# Patient Record
Sex: Female | Born: 1935 | Race: White | Hispanic: No | Smoking: Former smoker
Health system: Southern US, Community
[De-identification: ages and names within clinical notes are randomized; demographics above are authoritative.]

## PROBLEM LIST (undated history)

## (undated) DIAGNOSIS — F419 Anxiety disorder, unspecified: Secondary | ICD-10-CM

## (undated) DIAGNOSIS — M1990 Unspecified osteoarthritis, unspecified site: Secondary | ICD-10-CM

## (undated) HISTORY — PX: APPENDECTOMY: SHX54

## (undated) HISTORY — DX: Unspecified osteoarthritis, unspecified site: M19.90

## (undated) HISTORY — PX: ABDOMINAL HYSTERECTOMY: SHX81

---

## 1999-01-20 ENCOUNTER — Other Ambulatory Visit: Admission: RE | Admit: 1999-01-20 | Discharge: 1999-01-20 | Payer: Self-pay | Admitting: Obstetrics and Gynecology

## 2000-01-21 ENCOUNTER — Other Ambulatory Visit: Admission: RE | Admit: 2000-01-21 | Discharge: 2000-01-21 | Payer: Self-pay | Admitting: Obstetrics and Gynecology

## 2001-01-20 ENCOUNTER — Other Ambulatory Visit: Admission: RE | Admit: 2001-01-20 | Discharge: 2001-01-20 | Payer: Self-pay | Admitting: Obstetrics and Gynecology

## 2001-09-08 ENCOUNTER — Encounter: Payer: Self-pay | Admitting: Emergency Medicine

## 2001-09-08 ENCOUNTER — Emergency Department (HOSPITAL_COMMUNITY): Admission: EM | Admit: 2001-09-08 | Discharge: 2001-09-08 | Payer: Self-pay | Admitting: Emergency Medicine

## 2002-06-25 ENCOUNTER — Ambulatory Visit (HOSPITAL_COMMUNITY): Admission: RE | Admit: 2002-06-25 | Discharge: 2002-06-25 | Payer: Self-pay | Admitting: Gastroenterology

## 2004-01-20 ENCOUNTER — Ambulatory Visit (HOSPITAL_COMMUNITY): Admission: RE | Admit: 2004-01-20 | Discharge: 2004-01-20 | Payer: Self-pay | Admitting: Orthopedic Surgery

## 2010-07-19 ENCOUNTER — Encounter: Payer: Self-pay | Admitting: Orthopedic Surgery

## 2011-03-18 ENCOUNTER — Other Ambulatory Visit: Payer: Self-pay | Admitting: Radiology

## 2011-07-08 DIAGNOSIS — K219 Gastro-esophageal reflux disease without esophagitis: Secondary | ICD-10-CM | POA: Diagnosis not present

## 2011-07-08 DIAGNOSIS — E785 Hyperlipidemia, unspecified: Secondary | ICD-10-CM | POA: Diagnosis not present

## 2011-07-08 DIAGNOSIS — Z0000 Encounter for general adult medical examination without abnormal findings: Secondary | ICD-10-CM | POA: Diagnosis not present

## 2011-07-08 DIAGNOSIS — M899 Disorder of bone, unspecified: Secondary | ICD-10-CM | POA: Diagnosis not present

## 2011-07-08 DIAGNOSIS — Z79899 Other long term (current) drug therapy: Secondary | ICD-10-CM | POA: Diagnosis not present

## 2011-07-08 DIAGNOSIS — Z124 Encounter for screening for malignant neoplasm of cervix: Secondary | ICD-10-CM | POA: Diagnosis not present

## 2011-07-12 DIAGNOSIS — Z1212 Encounter for screening for malignant neoplasm of rectum: Secondary | ICD-10-CM | POA: Diagnosis not present

## 2011-09-21 DIAGNOSIS — H4010X0 Unspecified open-angle glaucoma, stage unspecified: Secondary | ICD-10-CM | POA: Diagnosis not present

## 2011-09-21 DIAGNOSIS — H409 Unspecified glaucoma: Secondary | ICD-10-CM | POA: Diagnosis not present

## 2011-11-29 ENCOUNTER — Other Ambulatory Visit: Payer: Self-pay | Admitting: Gastroenterology

## 2011-11-29 DIAGNOSIS — D126 Benign neoplasm of colon, unspecified: Secondary | ICD-10-CM | POA: Diagnosis not present

## 2011-11-29 DIAGNOSIS — Z86010 Personal history of colonic polyps: Secondary | ICD-10-CM | POA: Diagnosis not present

## 2011-11-29 DIAGNOSIS — Z09 Encounter for follow-up examination after completed treatment for conditions other than malignant neoplasm: Secondary | ICD-10-CM | POA: Diagnosis not present

## 2012-03-20 DIAGNOSIS — Z1231 Encounter for screening mammogram for malignant neoplasm of breast: Secondary | ICD-10-CM | POA: Diagnosis not present

## 2012-07-04 DIAGNOSIS — Z79899 Other long term (current) drug therapy: Secondary | ICD-10-CM | POA: Diagnosis not present

## 2012-07-04 DIAGNOSIS — M899 Disorder of bone, unspecified: Secondary | ICD-10-CM | POA: Diagnosis not present

## 2012-07-04 DIAGNOSIS — E785 Hyperlipidemia, unspecified: Secondary | ICD-10-CM | POA: Diagnosis not present

## 2012-07-14 DIAGNOSIS — M949 Disorder of cartilage, unspecified: Secondary | ICD-10-CM | POA: Diagnosis not present

## 2012-07-14 DIAGNOSIS — Z79899 Other long term (current) drug therapy: Secondary | ICD-10-CM | POA: Diagnosis not present

## 2012-07-14 DIAGNOSIS — K219 Gastro-esophageal reflux disease without esophagitis: Secondary | ICD-10-CM | POA: Diagnosis not present

## 2012-07-14 DIAGNOSIS — Z1331 Encounter for screening for depression: Secondary | ICD-10-CM | POA: Diagnosis not present

## 2012-07-14 DIAGNOSIS — Z0000 Encounter for general adult medical examination without abnormal findings: Secondary | ICD-10-CM | POA: Diagnosis not present

## 2012-07-14 DIAGNOSIS — E785 Hyperlipidemia, unspecified: Secondary | ICD-10-CM | POA: Diagnosis not present

## 2012-07-17 DIAGNOSIS — Z1212 Encounter for screening for malignant neoplasm of rectum: Secondary | ICD-10-CM | POA: Diagnosis not present

## 2012-10-31 DIAGNOSIS — H4010X0 Unspecified open-angle glaucoma, stage unspecified: Secondary | ICD-10-CM | POA: Diagnosis not present

## 2012-10-31 DIAGNOSIS — H35039 Hypertensive retinopathy, unspecified eye: Secondary | ICD-10-CM | POA: Diagnosis not present

## 2012-10-31 DIAGNOSIS — H26499 Other secondary cataract, unspecified eye: Secondary | ICD-10-CM | POA: Diagnosis not present

## 2012-10-31 DIAGNOSIS — Z961 Presence of intraocular lens: Secondary | ICD-10-CM | POA: Diagnosis not present

## 2012-10-31 DIAGNOSIS — H4011X0 Primary open-angle glaucoma, stage unspecified: Secondary | ICD-10-CM | POA: Diagnosis not present

## 2012-10-31 DIAGNOSIS — H353190 Nonexudative age-related macular degeneration, unspecified eye, stage unspecified: Secondary | ICD-10-CM | POA: Diagnosis not present

## 2012-10-31 DIAGNOSIS — H409 Unspecified glaucoma: Secondary | ICD-10-CM | POA: Diagnosis not present

## 2012-12-21 DIAGNOSIS — IMO0002 Reserved for concepts with insufficient information to code with codable children: Secondary | ICD-10-CM | POA: Diagnosis not present

## 2013-02-02 DIAGNOSIS — M25569 Pain in unspecified knee: Secondary | ICD-10-CM | POA: Diagnosis not present

## 2013-02-27 DIAGNOSIS — H4011X0 Primary open-angle glaucoma, stage unspecified: Secondary | ICD-10-CM | POA: Diagnosis not present

## 2013-02-27 DIAGNOSIS — H409 Unspecified glaucoma: Secondary | ICD-10-CM | POA: Diagnosis not present

## 2013-02-27 DIAGNOSIS — L719 Rosacea, unspecified: Secondary | ICD-10-CM | POA: Diagnosis not present

## 2013-03-12 DIAGNOSIS — M25569 Pain in unspecified knee: Secondary | ICD-10-CM | POA: Diagnosis not present

## 2013-03-27 DIAGNOSIS — Z1231 Encounter for screening mammogram for malignant neoplasm of breast: Secondary | ICD-10-CM | POA: Diagnosis not present

## 2013-04-02 ENCOUNTER — Other Ambulatory Visit: Payer: Self-pay | Admitting: Internal Medicine

## 2013-04-02 DIAGNOSIS — R928 Other abnormal and inconclusive findings on diagnostic imaging of breast: Secondary | ICD-10-CM

## 2013-04-30 DIAGNOSIS — N6009 Solitary cyst of unspecified breast: Secondary | ICD-10-CM | POA: Diagnosis not present

## 2013-04-30 DIAGNOSIS — N6489 Other specified disorders of breast: Secondary | ICD-10-CM | POA: Diagnosis not present

## 2013-07-16 DIAGNOSIS — R82998 Other abnormal findings in urine: Secondary | ICD-10-CM | POA: Diagnosis not present

## 2013-07-16 DIAGNOSIS — M949 Disorder of cartilage, unspecified: Secondary | ICD-10-CM | POA: Diagnosis not present

## 2013-07-16 DIAGNOSIS — M899 Disorder of bone, unspecified: Secondary | ICD-10-CM | POA: Diagnosis not present

## 2013-07-16 DIAGNOSIS — E785 Hyperlipidemia, unspecified: Secondary | ICD-10-CM | POA: Diagnosis not present

## 2013-07-23 DIAGNOSIS — Z0000 Encounter for general adult medical examination without abnormal findings: Secondary | ICD-10-CM | POA: Diagnosis not present

## 2013-07-23 DIAGNOSIS — M949 Disorder of cartilage, unspecified: Secondary | ICD-10-CM | POA: Diagnosis not present

## 2013-07-23 DIAGNOSIS — K219 Gastro-esophageal reflux disease without esophagitis: Secondary | ICD-10-CM | POA: Diagnosis not present

## 2013-07-23 DIAGNOSIS — M899 Disorder of bone, unspecified: Secondary | ICD-10-CM | POA: Diagnosis not present

## 2013-07-23 DIAGNOSIS — Z1331 Encounter for screening for depression: Secondary | ICD-10-CM | POA: Diagnosis not present

## 2013-07-23 DIAGNOSIS — E785 Hyperlipidemia, unspecified: Secondary | ICD-10-CM | POA: Diagnosis not present

## 2013-07-23 DIAGNOSIS — Z79899 Other long term (current) drug therapy: Secondary | ICD-10-CM | POA: Diagnosis not present

## 2013-07-27 DIAGNOSIS — Z1212 Encounter for screening for malignant neoplasm of rectum: Secondary | ICD-10-CM | POA: Diagnosis not present

## 2013-11-20 DIAGNOSIS — H409 Unspecified glaucoma: Secondary | ICD-10-CM | POA: Diagnosis not present

## 2013-11-20 DIAGNOSIS — H524 Presbyopia: Secondary | ICD-10-CM | POA: Diagnosis not present

## 2013-11-20 DIAGNOSIS — H4011X0 Primary open-angle glaucoma, stage unspecified: Secondary | ICD-10-CM | POA: Diagnosis not present

## 2013-11-20 DIAGNOSIS — H353190 Nonexudative age-related macular degeneration, unspecified eye, stage unspecified: Secondary | ICD-10-CM | POA: Diagnosis not present

## 2013-11-20 DIAGNOSIS — H35039 Hypertensive retinopathy, unspecified eye: Secondary | ICD-10-CM | POA: Diagnosis not present

## 2014-04-20 ENCOUNTER — Ambulatory Visit (INDEPENDENT_AMBULATORY_CARE_PROVIDER_SITE_OTHER): Payer: Medicare Other

## 2014-04-20 ENCOUNTER — Ambulatory Visit (INDEPENDENT_AMBULATORY_CARE_PROVIDER_SITE_OTHER): Payer: Medicare Other | Admitting: Emergency Medicine

## 2014-04-20 VITALS — BP 136/62 | HR 64 | Temp 97.6°F | Resp 18 | Ht 59.5 in | Wt 151.2 lb

## 2014-04-20 DIAGNOSIS — M899 Disorder of bone, unspecified: Secondary | ICD-10-CM

## 2014-04-20 DIAGNOSIS — M898X5 Other specified disorders of bone, thigh: Secondary | ICD-10-CM

## 2014-04-20 DIAGNOSIS — M25552 Pain in left hip: Secondary | ICD-10-CM | POA: Diagnosis not present

## 2014-04-20 DIAGNOSIS — M4310 Spondylolisthesis, site unspecified: Secondary | ICD-10-CM

## 2014-04-20 DIAGNOSIS — M4316 Spondylolisthesis, lumbar region: Secondary | ICD-10-CM | POA: Diagnosis not present

## 2014-04-20 MED ORDER — PREDNISONE 10 MG PO TABS
ORAL_TABLET | ORAL | Status: DC
Start: 2014-04-20 — End: 2015-05-12

## 2014-04-20 NOTE — Patient Instructions (Signed)
Spondylolisthesis  The slipping of one or multiple vertebrae out of the correct anatomical position is a condition known as spondylolisthesis. Spondylolisthesis is most common in adolescents and is caused by a number of different reasons, such as vertebral fracture or something you are born with (congenital). Spondylolisthesis is diagnosed with the use of X-rays. SYMPTOMS   Dull, achy pain in the lower back.  Pain that worsens with extension of the spine.  Tightness of the muscles on the back of the thigh.  Lower back stiffness.  Signs of nerve damage: pain, numbness, or weakness affecting one or both lower extremities.  Muscle wasting (atrophy), uncommon.  Loss of stool (bowel) or urine (bladder) function. CAUSES  The symptoms of spondylolisthesis are caused by one or more vertebrae that are out of alignment, placing pressure on the spinal cord. Common mechanisms of injury include:  Congenital defect of the spine.  Degenerative process.  Stress fracture of the spine.  Fracture due to trauma to the spine. RISK INCREASES WITH:  Activities that have a risk of hyperextending the back.  Activities that have a risk of excessively rotating the spine.  Poor strength and flexibility.  Failure to warm up properly before activity.  Family history of spondylolysis or spondylolisthesis.  Improper sports technique. PREVENTION  Warm up and stretch properly before activity.  Allow for adequate recovery between workouts.  Maintain physical fitness:  Strength, flexibility, and endurance.  Cardiovascular fitness.  Learn and use proper technique. When possible, have a coach correct improper technique. PROGNOSIS  If treated properly, the spondylolisthesis usually resolves. RELATED COMPLICATIONS   Recurrent symptoms that result in a chronic problem.  Inability to compete in athletics.  Prolonged healing time, if improperly treated or reinjured.  Failure of the fracture to  heal (nonunion).  Healing of the fracture in a poor position (malunion). TREATMENT Treatment initially involves resting from any activities that aggravate the symptoms and the use of ice and medications to help reduce pain and inflammation. The use of strengthening and stretching exercises may help reduce pain with activity. These exercises may be performed at home or with referral to a therapist. It is important to learn how to use proper body mechanics as to not place undue stress on your spine. If the injury is severe, then your caregiver may recommend a back brace to allow for healing, or even surgery. Surgery often involves fusing two adjacent vertebrae so no movement is allowed between them.  MEDICATION   If pain medication is necessary, then nonsteroidal anti-inflammatory medications, such as aspirin and ibuprofen, or other minor pain relievers, such as acetaminophen, are often recommended.  Do not take pain medication for 7 days before surgery.  Prescription pain relievers may be given if deemed necessary by your caregiver. Use only as directed and only as much as you need. HEAT AND COLD  Cold treatment (icing) relieves pain and reduces inflammation. Cold treatment should be applied for 10 to 15 minutes every 2 to 3 hours for inflammation and pain and immediately after any activity that aggravates your symptoms. Use ice packs or massage the area with a piece of ice (ice massage).  Heat treatment may be used prior to performing the stretching and strengthening activities prescribed by your caregiver, physical therapist, or athletic trainer. Use a heat pack or soak the injury in warm water. SEEK MEDICAL CARE IF:  Treatment seems to offer no benefit, or the condition worsens.  Any medications produce adverse side effects.  Any complications from surgery occur:  Pain, numbness, or coldness in the extremity operated upon.  Discoloration of the nail beds (they become blue or gray) of the  extremity operated upon.  Signs of infections (fever, pain, inflammation, redness, or persistent bleeding).

## 2014-04-20 NOTE — Progress Notes (Signed)
Subjective:  This chart was scribed for Eliezer Champagne, MD by Mercy Moore, Medial Scribe. This patient was seen in room 4 and the patient's care was started at 8:53 AM.    Patient ID: Cheyenne Montgomery, female    DOB: Nov 07, 1934, 79 y.o.   MRN: [id number]  Chief Complaint  Patient presents with  . Leg Pain    right leg from knee to hip x 1 week    HPI HPI Comments: Cheyenne Montgomery is a 79 y.o. female who presents to the Emergency Department complaining of right leg pain for 1.5 weeks. Patient reports recently having hardwood floors installed in her home. After the installation she reports helping move things back into the house. As a result of all the activity patient reports development of left leg pain due to over exertion. Patient locates left leg pain primarily in her knee that extends laterally up to the hip. Patient also describes associated tinge in her left toes. Patient reports sleep disturbance due to pain severity stating that it wakes her up during the night. Patient reports treatment with ibuprofen without relief. Patient denies right leg pain or mid back pain.   There are no active problems to display for this patient.  No past medical history on file. No past surgical history on file. Allergies  Allergen Reactions  . Doxycycline Other (See Comments)    unknown  . Penicillins Rash   Prior to Admission medications   Not on File   History   Social History  . Marital Status: Widowed    Spouse Name: N/A    Number of Children: N/A  . Years of Education: N/A   Occupational History  . Not on file.   Social History Main Topics  . Smoking status: Former Research scientist (life sciences)  . Smokeless tobacco: Never Used  . Alcohol Use: No  . Drug Use: No  . Sexual Activity: Not on file   Other Topics Concern  . Not on file   Social History Narrative  . No narrative on file    Review of Systems  Constitutional: Negative for fever and chills.  Musculoskeletal: Positive for arthralgias.  Negative for gait problem.  Neurological: Negative for weakness and numbness.       Objective:   Physical Exam  Nursing note and vitals reviewed.   CONSTITUTIONAL: Well developed/well nourished HEAD: Normocephalic/atraumatic EYES: EOMI/PERRL ENMT: Mucous membranes moist NECK: supple no meningeal signs SPINE:entire spine nontender CV: S1/S2 noted, no murmurs/rubs/gallops noted LUNGS: Lungs are clear to auscultation bilaterally, no apparent distress ABDOMEN: soft, nontender, no rebound or guarding GU:no cva tenderness NEURO: Pt is awake/alert, moves all extremitiesx4 EXTREMITIES: pulses normal, full ROM; no tenderness over the lumbar spine; mild tenderness to upper lateral left thigh which extends down to the knee; mild discomfort with external/internal rotation of the hip; reflexes and motor strength are symmetrical; healed scars over both great toes SKIN: warm, color normal PSYCH: no abnormalities of mood noted  Filed Vitals:   04/20/14 0846  BP: 136/62  Pulse: 64  Temp: 97.6 F (36.4 C)  TempSrc: Oral  Resp: 18  Height: 4' 11.5" (1.511 m)  Weight: 151 lb 3.2 oz (68.584 kg)  SpO2: 96%  [facility] reading (PRIMARY) by  Dr. Everlene Farrier LS spine films show an L4-L5 grade 1 spondylolisthesis. Hip films show mild degenerative changes. Femur films are normal.       Assessment & Plan:  Patient has a radiculopathy most likely secondary to her spondylolisthesis. Advise she see a  specialist regarding this. She will be on a prednisone taper. Her son-in-law is a physician and she was given copies of her x-rays for him to review.   I personally performed the services described in this documentation, which was scribed in my presence. The recorded information has been reviewed and is accurate.

## 2014-04-26 DIAGNOSIS — H35372 Puckering of macula, left eye: Secondary | ICD-10-CM | POA: Diagnosis not present

## 2014-04-26 DIAGNOSIS — H4010X0 Unspecified open-angle glaucoma, stage unspecified: Secondary | ICD-10-CM | POA: Diagnosis not present

## 2014-04-26 DIAGNOSIS — H5005 Alternating esotropia: Secondary | ICD-10-CM | POA: Diagnosis not present

## 2014-04-26 DIAGNOSIS — H3531 Nonexudative age-related macular degeneration: Secondary | ICD-10-CM | POA: Diagnosis not present

## 2014-05-07 DIAGNOSIS — Z1231 Encounter for screening mammogram for malignant neoplasm of breast: Secondary | ICD-10-CM | POA: Diagnosis not present

## 2015-01-22 IMAGING — CR DG HIP (WITH OR WITHOUT PELVIS) 2-3V*L*
3 series · 3 of 3 positions shown · non-contrast
Comparison: No priors.

CLINICAL DATA: 79-year-old female complaining of left-sided hip
pain.

EXAM:
LEFT HIP - COMPLETE 2+ VIEW

[AP (1 of 2)]
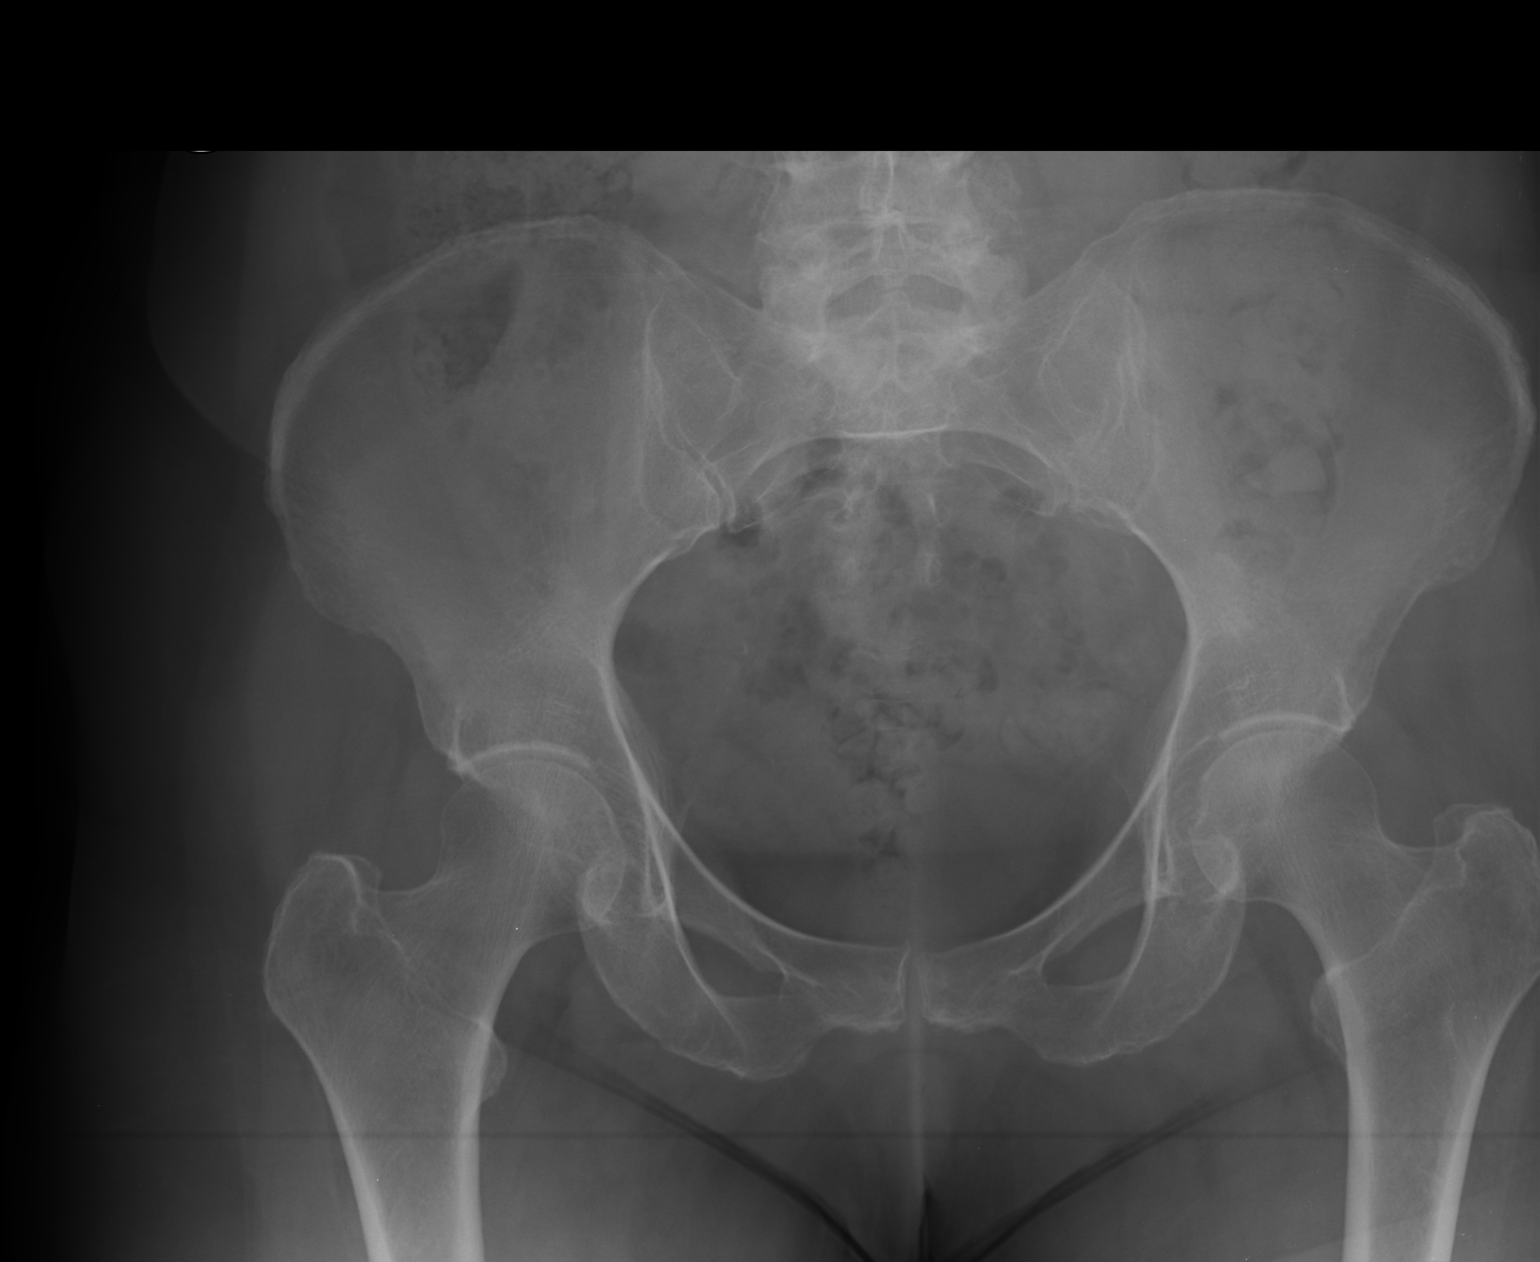

[AP (2 of 2)]
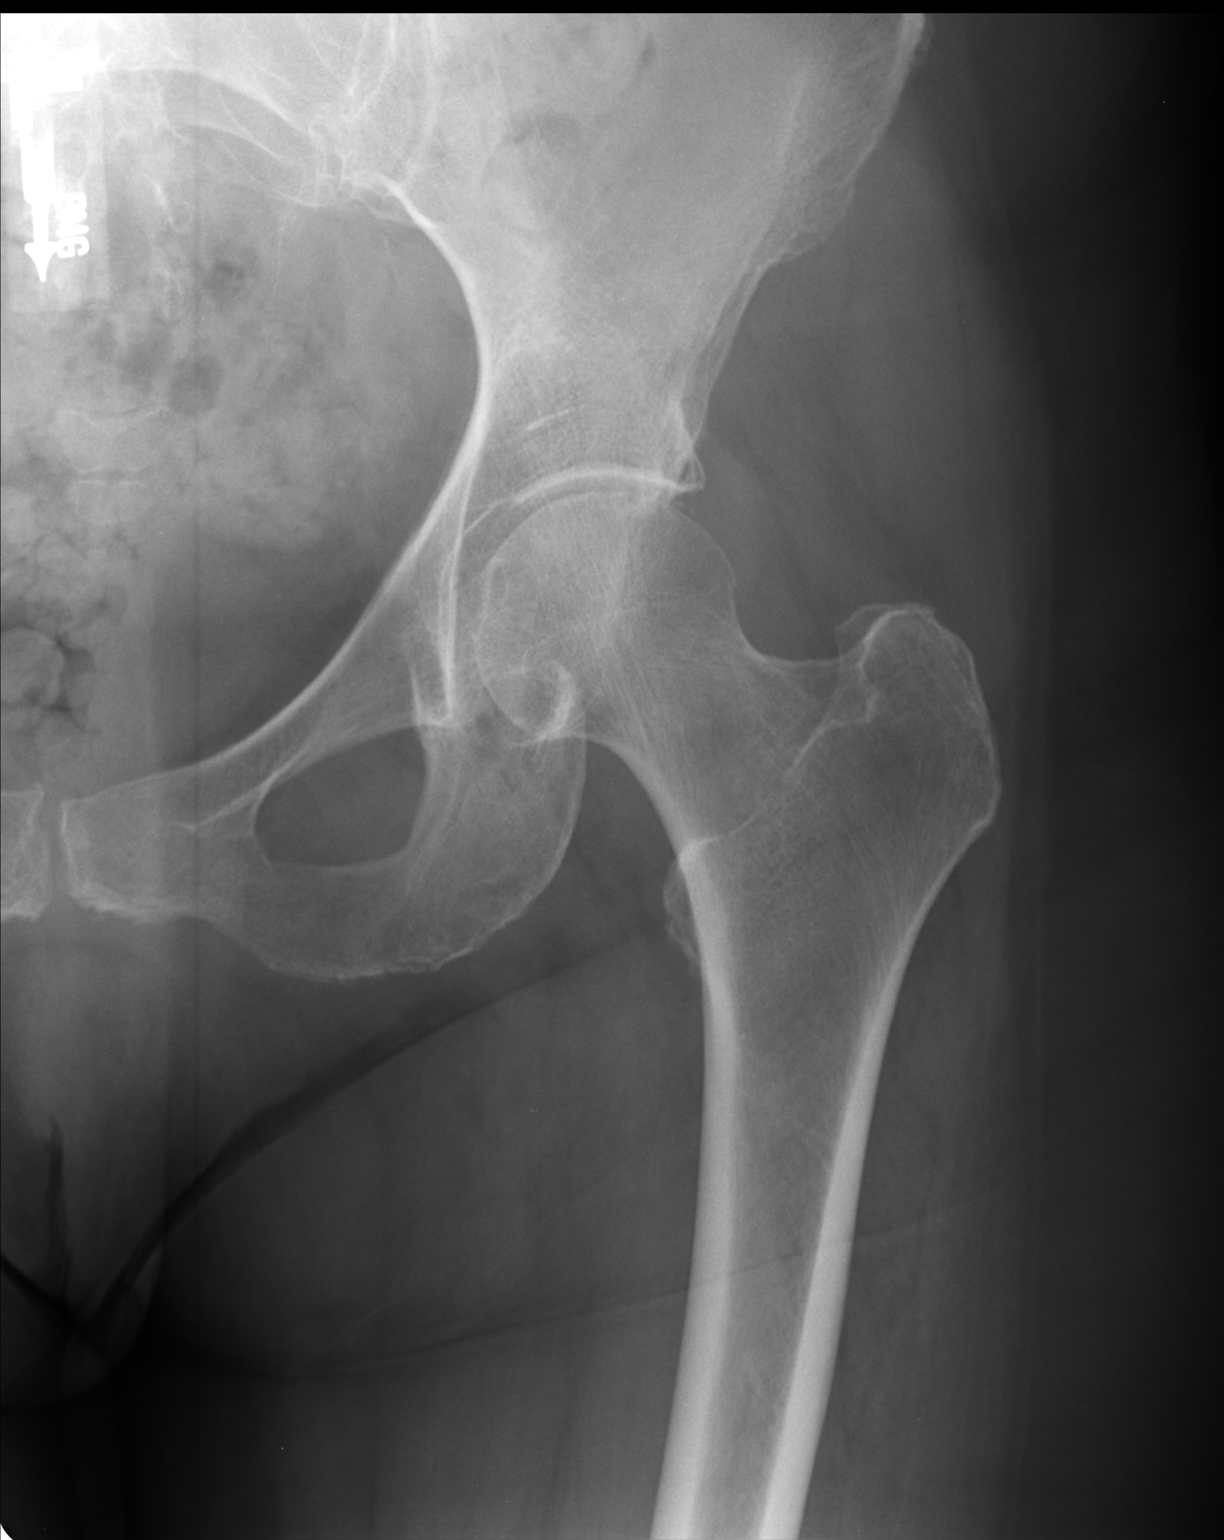

[lateral]
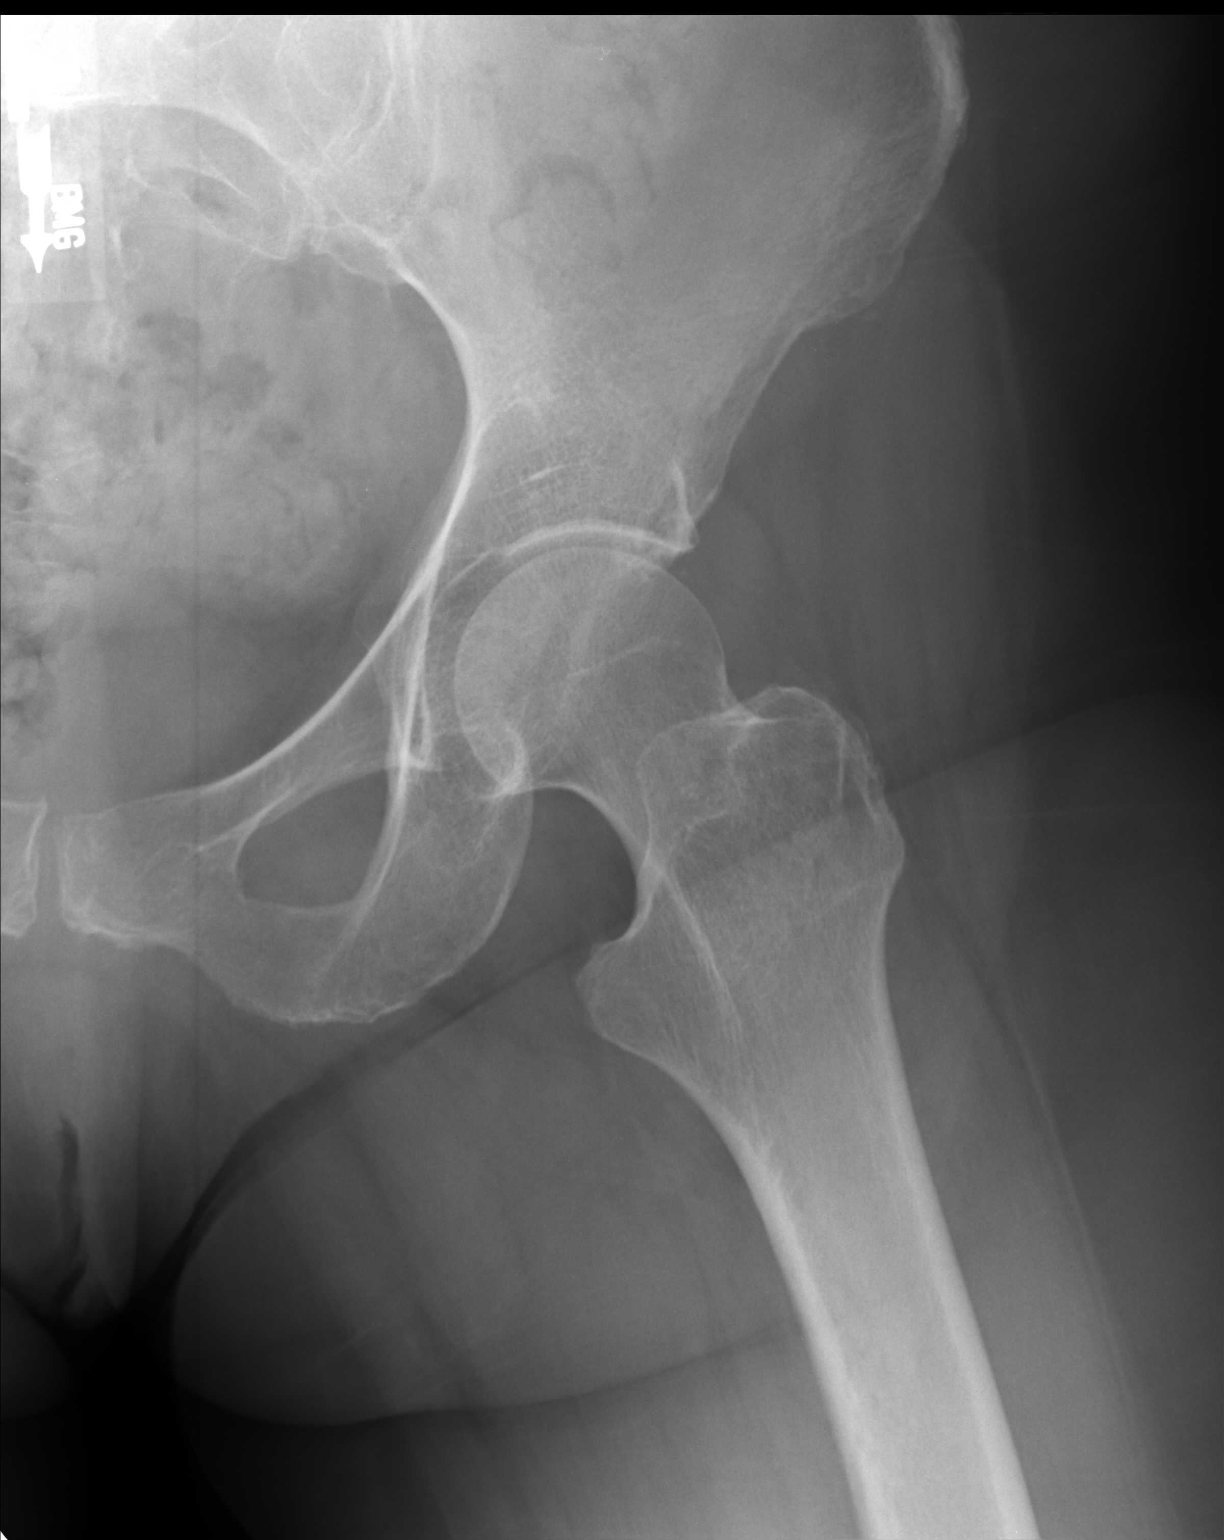

[3 of 3 positions shown; findings below may reference images not displayed]

FINDINGS: AP view of the pelvis and AP and lateral views of the left hip
demonstrate no acute displaced fracture of the bony pelvic ring.
Bilateral proximal femurs as visualized are intact, and the left
femoral head is properly located. Mild degenerative changes of
osteoarthritis are noted in the hip joints bilaterally.
IMPRESSION: 1. No acute radiographic abnormality of the bony pelvis or the left
hip.
2. Mild bilateral hip joint osteoarthritis.

## 2015-01-22 IMAGING — CR DG FEMUR 2V*L*
4 series · 4 of 4 positions shown · non-contrast
Comparison: No priors.

CLINICAL DATA: [age] year female complaining of left leg
pain.

EXAM:
LEFT FEMUR - 2 VIEW

[AP (1 of 2)]
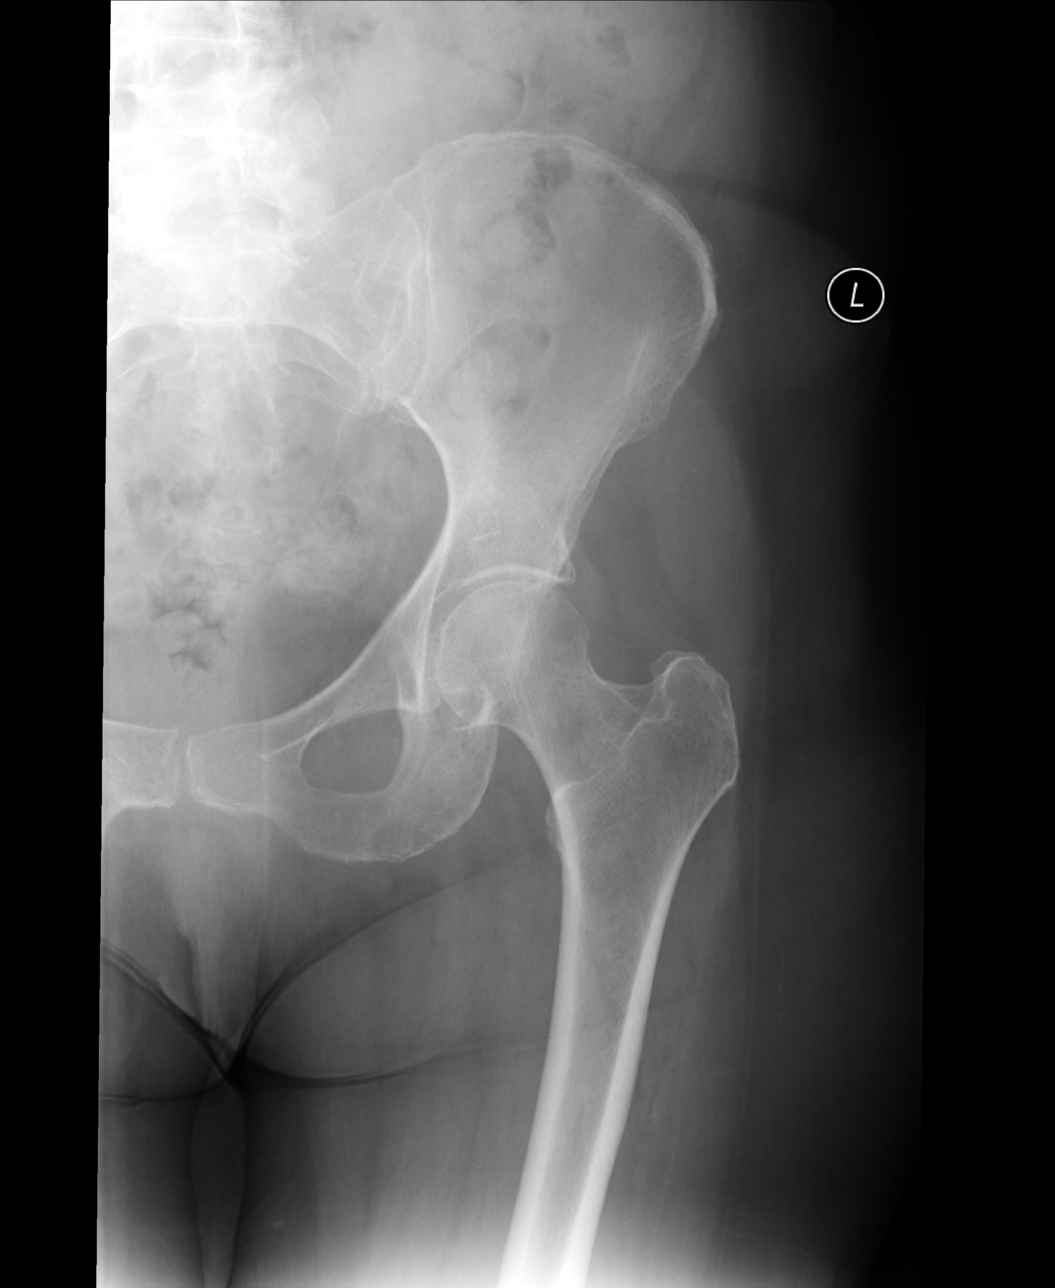

[lateral (1 of 2)]
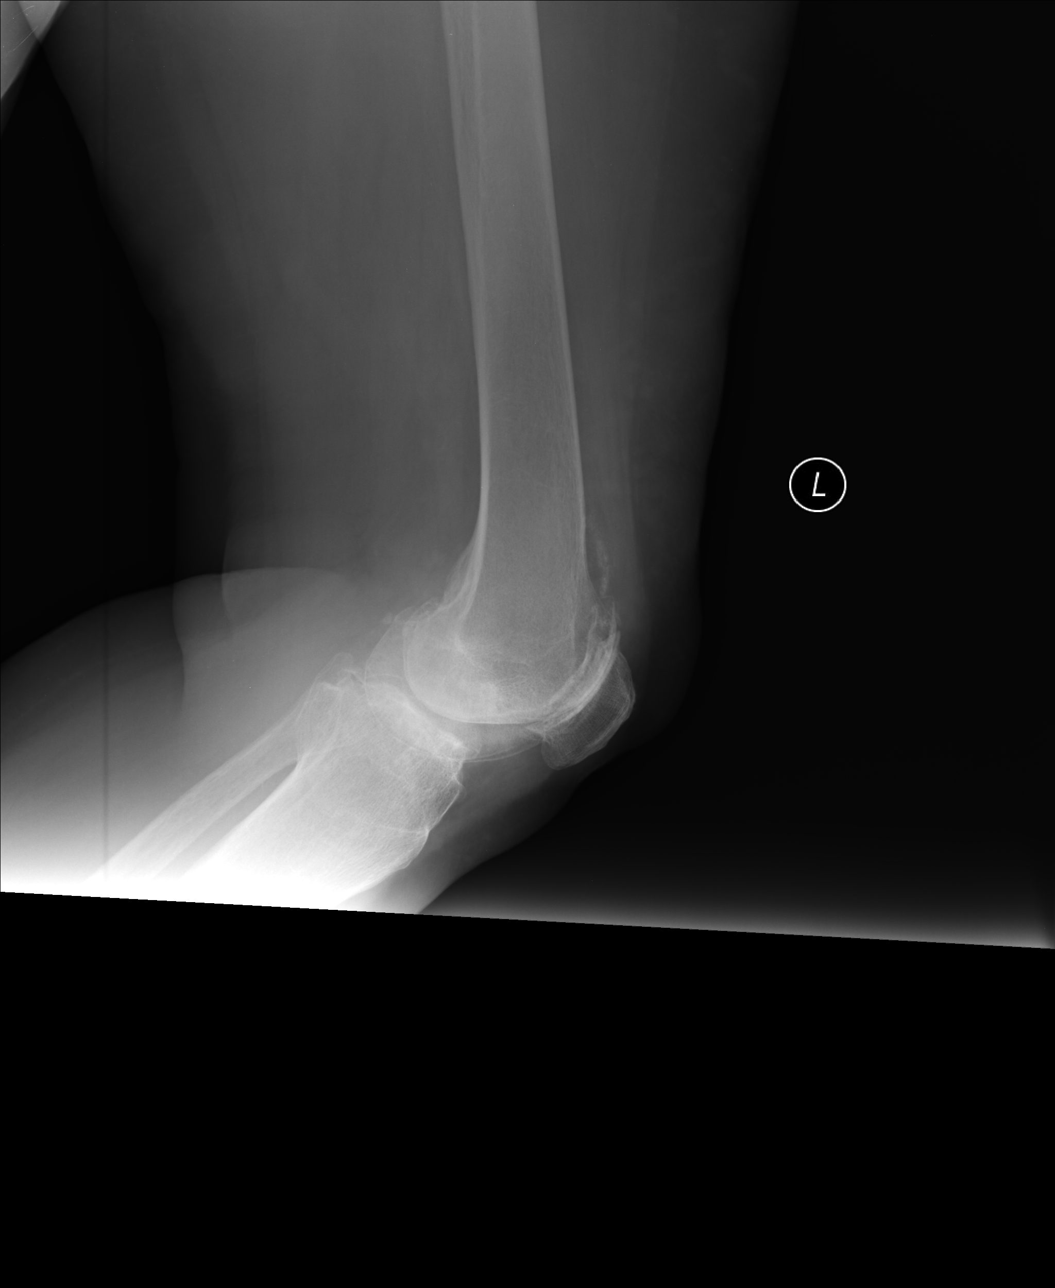

[AP (2 of 2)]
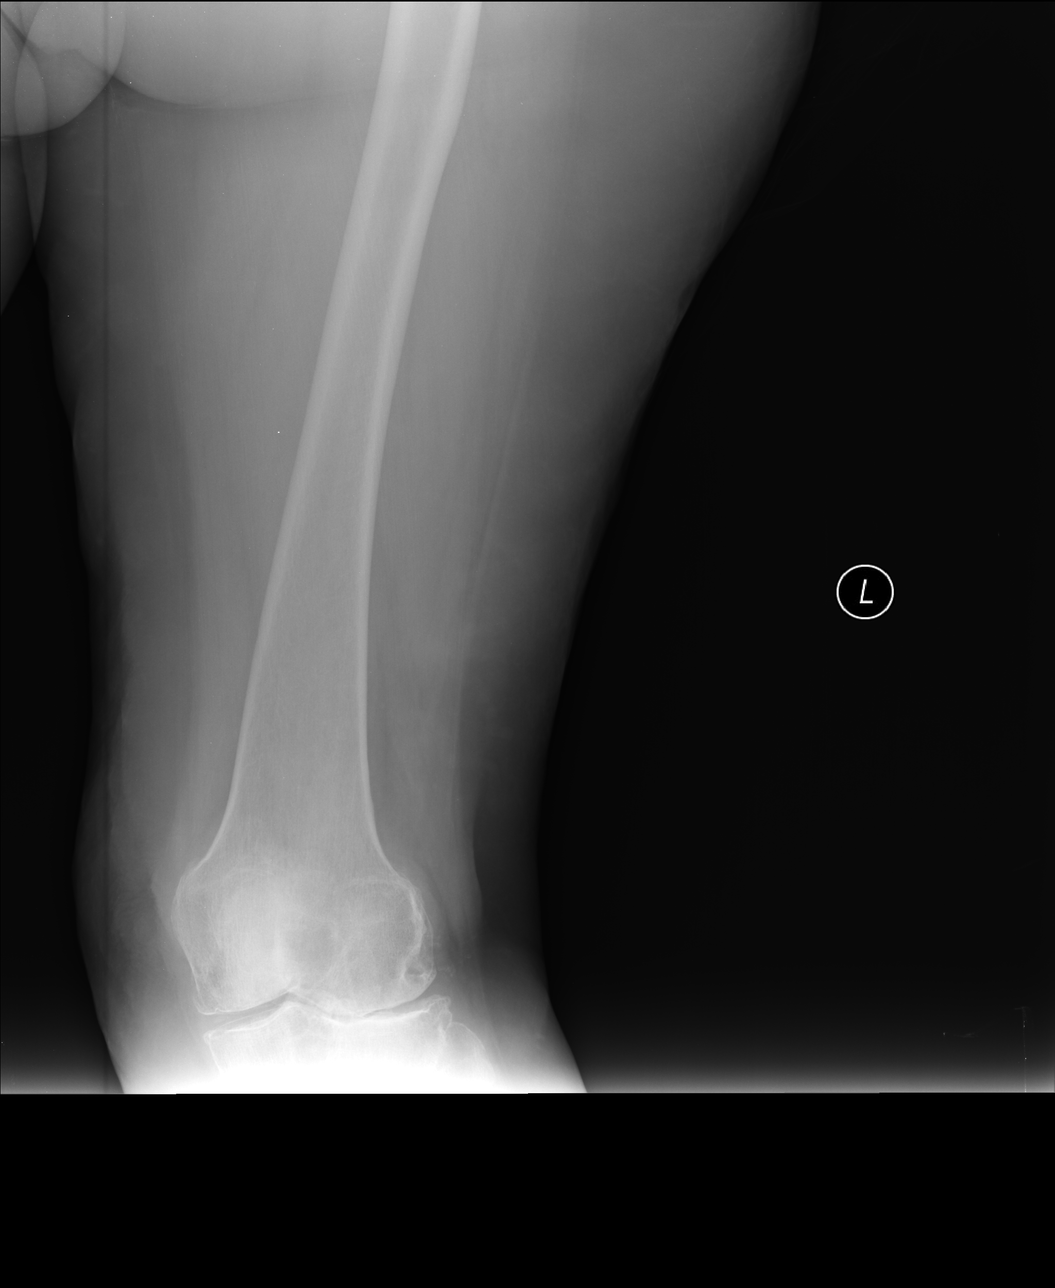

[lateral (2 of 2)]
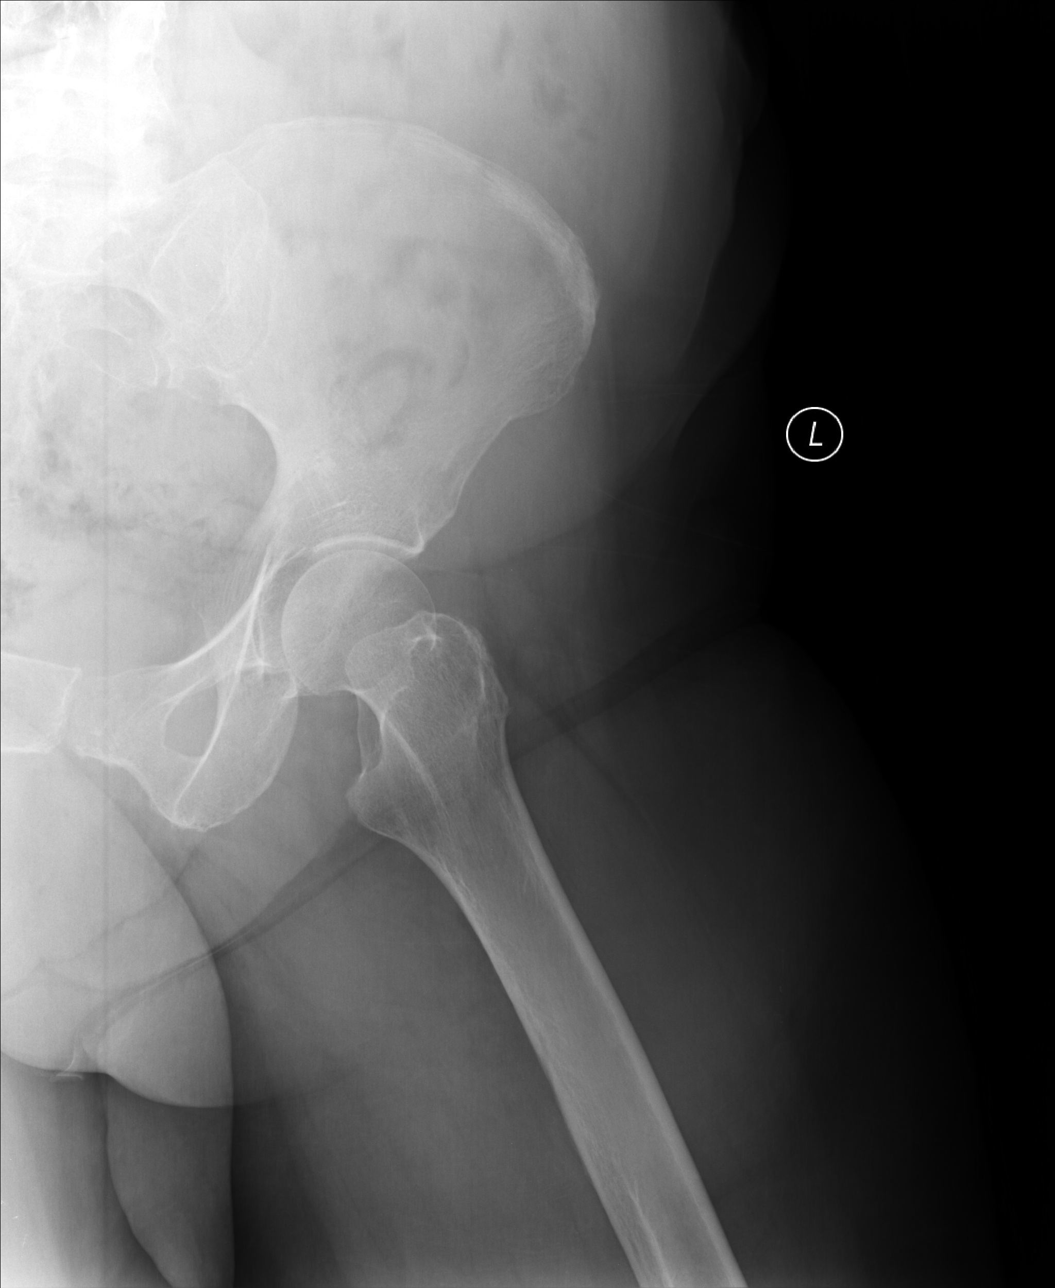

[4 of 4 positions shown; findings below may reference images not displayed]

FINDINGS: No acute displaced fracture of the femur. Advanced degenerative
changes of osteoarthritis are noted in the knee joint, most severe
in the lateral compartment and patellofemoral compartment, and there
appear to be multiple indistinct calcifications in the joint space,
particularly along the suprapatellar bursa, suggestive of potential
synovial osteochondromatosis or dystrophic synovial calcifications.
IMPRESSION: 1. No acute radiographic abnormality of the left femur.
2. Osteoarthritis in the knee joint, most severe in the lateral and
patellofemoral compartments.
3. Multifocal calcifications in the joint space suggestive of
potential synovial osteochondromatosis or dystrophic synovial
calcifications.

## 2015-05-12 ENCOUNTER — Encounter (HOSPITAL_BASED_OUTPATIENT_CLINIC_OR_DEPARTMENT_OTHER): Payer: Self-pay | Admitting: *Deleted

## 2015-05-13 ENCOUNTER — Encounter (HOSPITAL_BASED_OUTPATIENT_CLINIC_OR_DEPARTMENT_OTHER)
Admission: RE | Admit: 2015-05-13 | Discharge: 2015-05-13 | Disposition: A | Discharge status: Home / Self Care | Payer: Medicare Other | Source: Ambulatory Visit | Attending: Plastic Surgery | Admitting: Plastic Surgery

## 2015-05-13 DIAGNOSIS — F419 Anxiety disorder, unspecified: Secondary | ICD-10-CM | POA: Diagnosis not present

## 2015-05-13 DIAGNOSIS — H5347 Heteronymous bilateral field defects: Secondary | ICD-10-CM | POA: Diagnosis not present

## 2015-05-13 DIAGNOSIS — M1990 Unspecified osteoarthritis, unspecified site: Secondary | ICD-10-CM | POA: Diagnosis not present

## 2015-05-13 DIAGNOSIS — Z87891 Personal history of nicotine dependence: Secondary | ICD-10-CM | POA: Diagnosis not present

## 2015-05-13 DIAGNOSIS — H02403 Unspecified ptosis of bilateral eyelids: Secondary | ICD-10-CM | POA: Diagnosis not present

## 2015-05-13 LAB — CBC WITH DIFFERENTIAL/PLATELET
Basophils Absolute: 0.1 10*3/uL (ref 0.0–0.1)
Basophils Relative: 1 %
Eosinophils Absolute: 0.2 10*3/uL (ref 0.0–0.7)
Eosinophils Relative: 3 %
HEMATOCRIT: 38.1 % (ref 36.0–46.0)
HEMOGLOBIN: 12.5 g/dL (ref 12.0–15.0)
LYMPHS ABS: 1.3 10*3/uL (ref 0.7–4.0)
Lymphocytes Relative: 27 %
MCH: 31.2 pg (ref 26.0–34.0)
MCHC: 32.8 g/dL (ref 30.0–36.0)
MCV: 95 fL (ref 78.0–100.0)
MONOS PCT: 9 %
Monocytes Absolute: 0.4 10*3/uL (ref 0.1–1.0)
NEUTROS ABS: 2.9 10*3/uL (ref 1.7–7.7)
NEUTROS PCT: 60 %
Platelets: 196 10*3/uL (ref 150–400)
RBC: 4.01 MIL/uL (ref 3.87–5.11)
RDW: 13 % (ref 11.5–15.5)
WBC: 4.8 10*3/uL (ref 4.0–10.5)

## 2015-05-13 LAB — BASIC METABOLIC PANEL
ANION GAP: 4 — AB (ref 5–15)
BUN: 12 mg/dL (ref 6–20)
CHLORIDE: 105 mmol/L (ref 101–111)
CO2: 31 mmol/L (ref 22–32)
Calcium: 9.7 mg/dL (ref 8.9–10.3)
Creatinine, Ser: 0.75 mg/dL (ref 0.44–1.00)
GFR calc non Af Amer: >60 mL/min (ref >60)
Glucose, Bld: 92 mg/dL (ref 65–99)
Potassium: 4.5 mmol/L (ref 3.5–5.1)
Sodium: 140 mmol/L (ref 135–145)

## 2015-05-14 NOTE — H&P (Signed)
  Subjective:    Patient ID: Cheyenne Montgomery is a 80 y.o. female.  Follow-up   Patient with recent VFE demonstrating bilateral superior filed defect at least 20 degrees that resolved with taping. Plan bilateral ptosis repair through upper blepharoplasty incisions, direct brow lift prehairline. Wears glasses for reading. Has history glaucoma on right and underwent surgery for this with resolution. Uses OTC eye drops for dry eyes daily, worse a little on right.   Review of Systems     Objective:   Physical Exam  HENT:  Well healed facelift incisions  Cardiovascular: Normal rate, regular rhythm and normal heart sounds.  Pulmonary/Chest: Effort normal and breath sounds normal.  Skin:  Fitzpatrick 2   HEENT: with frontalis blocked,  Brow to Mid pupil R 1.5 cm L 1.8 cm MRD Right 0 Left 87mm No lower lid malposition, able to distract lower lids > 4 mm from globe with delayed snap back EOMI, With frontalis blocked, levator excursion approximately 12 mm bilateral  Hairline to brow over 6 cm    Assessment:     Eyelid ptosis Brow ptosis     Plan:     Reviewed ptosis repair with upper lid incision, levator plication vs advancement. Counseled most common problem with this surgery will be asymmetry or need for revision. Procedure could be done under MAC vs GA. This will exacerbate her dry eyes at least temporarily and possible permanently.  Reviewed brow lift prehairline direct excision, scar from temple to temple. Brows will descend with aging and may require additional procedures. Reviewed expected bruising, numbness forehead and scalp followed by itching sensation, risk scar alopecia, scar maturation over months, use of ophthalmic lubricant that can produce blurry vision.   New pictures taken. ASA has been held. Reviewed OP procedure, daughter will stay in home with her night of procedure. Rx for percocet given, counseled ok to use tylenol or advil instead.  Irene Limbo, MD  [facility] Plastic & Reconstructive Surgery [phone]

## 2015-05-16 ENCOUNTER — Ambulatory Visit (HOSPITAL_BASED_OUTPATIENT_CLINIC_OR_DEPARTMENT_OTHER): Payer: Medicare Other | Admitting: Anesthesiology

## 2015-05-16 ENCOUNTER — Encounter (HOSPITAL_BASED_OUTPATIENT_CLINIC_OR_DEPARTMENT_OTHER): Payer: Self-pay | Admitting: *Deleted

## 2015-05-16 ENCOUNTER — Encounter (HOSPITAL_BASED_OUTPATIENT_CLINIC_OR_DEPARTMENT_OTHER)
Admission: RE | Disposition: A | Discharge status: Home / Self Care | Payer: Self-pay | Source: Ambulatory Visit | Attending: Plastic Surgery

## 2015-05-16 ENCOUNTER — Ambulatory Visit (HOSPITAL_BASED_OUTPATIENT_CLINIC_OR_DEPARTMENT_OTHER)
Admission: RE | Admit: 2015-05-16 | Discharge: 2015-05-16 | Disposition: A | Discharge status: Home / Self Care | Payer: Medicare Other | Source: Ambulatory Visit | Attending: Plastic Surgery | Admitting: Plastic Surgery

## 2015-05-16 DIAGNOSIS — Z87891 Personal history of nicotine dependence: Secondary | ICD-10-CM | POA: Insufficient documentation

## 2015-05-16 DIAGNOSIS — F419 Anxiety disorder, unspecified: Secondary | ICD-10-CM | POA: Diagnosis not present

## 2015-05-16 DIAGNOSIS — H5347 Heteronymous bilateral field defects: Secondary | ICD-10-CM | POA: Diagnosis not present

## 2015-05-16 DIAGNOSIS — H02403 Unspecified ptosis of bilateral eyelids: Secondary | ICD-10-CM | POA: Diagnosis not present

## 2015-05-16 DIAGNOSIS — M1990 Unspecified osteoarthritis, unspecified site: Secondary | ICD-10-CM | POA: Insufficient documentation

## 2015-05-16 HISTORY — DX: Anxiety disorder, unspecified: F41.9

## 2015-05-16 HISTORY — PX: BROW LIFT: SHX178

## 2015-05-16 HISTORY — PX: PTOSIS REPAIR: SHX6568

## 2015-05-16 SURGERY — REPAIR, BLEPHAROPTOSIS
Anesthesia: General | Site: Face | Laterality: Bilateral

## 2015-05-16 MED ORDER — OXYCODONE HCL 5 MG/5ML PO SOLN: 5.0000 mg | Freq: Once | ORAL | Status: DC | PRN

## 2015-05-16 MED ORDER — FENTANYL CITRATE (PF) 100 MCG/2ML IJ SOLN
INTRAMUSCULAR | Status: AC
  Filled 2015-05-16: qty 2

## 2015-05-16 MED ORDER — TOBRAMYCIN-DEXAMETHASONE 0.3-0.1 % OP OINT
TOPICAL_OINTMENT | OPHTHALMIC | Status: AC
  Filled 2015-05-16: qty 3.5

## 2015-05-16 MED ORDER — PROPOFOL 10 MG/ML IV BOLUS
INTRAVENOUS | Status: DC | PRN
  Administered 2015-05-16: 110 mg via INTRAVENOUS

## 2015-05-16 MED ORDER — PHENYLEPHRINE HCL 10 MG/ML IJ SOLN
INTRAMUSCULAR | Status: DC | PRN
  Administered 2015-05-16 (×3): 40 ug via INTRAVENOUS

## 2015-05-16 MED ORDER — BUPIVACAINE-EPINEPHRINE (PF) 0.25% -1:200000 IJ SOLN
INTRAMUSCULAR | Status: AC
  Filled 2015-05-16: qty 30

## 2015-05-16 MED ORDER — MIDAZOLAM HCL 2 MG/2ML IJ SOLN
INTRAMUSCULAR | Status: AC
  Filled 2015-05-16: qty 2

## 2015-05-16 MED ORDER — PROPOFOL 500 MG/50ML IV EMUL
INTRAVENOUS | Status: AC
  Filled 2015-05-16: qty 50

## 2015-05-16 MED ORDER — GLYCOPYRROLATE 0.2 MG/ML IJ SOLN: 0.2000 mg | Freq: Once | INTRAMUSCULAR | Status: DC | PRN

## 2015-05-16 MED ORDER — LIDOCAINE-EPINEPHRINE 1 %-1:100000 IJ SOLN
INTRAMUSCULAR | Status: AC
  Filled 2015-05-16: qty 1

## 2015-05-16 MED ORDER — ONDANSETRON HCL 4 MG/2ML IJ SOLN
INTRAMUSCULAR | Status: AC
  Filled 2015-05-16: qty 2

## 2015-05-16 MED ORDER — ONDANSETRON HCL 4 MG/2ML IJ SOLN
INTRAMUSCULAR | Status: AC
Start: 2015-05-16 — End: 2015-05-16
  Filled 2015-05-16: qty 2

## 2015-05-16 MED ORDER — FENTANYL CITRATE (PF) 100 MCG/2ML IJ SOLN
INTRAMUSCULAR | Status: DC | PRN
  Administered 2015-05-16: 50 ug via INTRAVENOUS

## 2015-05-16 MED ORDER — TOBRAMYCIN-DEXAMETHASONE 0.3-0.1 % OP OINT
TOPICAL_OINTMENT | OPHTHALMIC | Status: DC | PRN
Start: 2015-05-16 — End: 2015-05-16
  Administered 2015-05-16: 1 via OPHTHALMIC

## 2015-05-16 MED ORDER — ONDANSETRON HCL 4 MG/2ML IJ SOLN
4.0000 mg | Freq: Four times a day (QID) | INTRAMUSCULAR | Status: AC | PRN
  Administered 2015-05-16: 4 mg via INTRAVENOUS

## 2015-05-16 MED ORDER — PHENYLEPHRINE 40 MCG/ML (10ML) SYRINGE FOR IV PUSH (FOR BLOOD PRESSURE SUPPORT)
PREFILLED_SYRINGE | INTRAVENOUS | Status: AC
  Filled 2015-05-16: qty 10

## 2015-05-16 MED ORDER — BACITRACIN 500 UNIT/GM EX OINT
TOPICAL_OINTMENT | CUTANEOUS | Status: DC | PRN
  Administered 2015-05-16: 1 via TOPICAL

## 2015-05-16 MED ORDER — CLINDAMYCIN PHOSPHATE 600 MG/50ML IV SOLN
INTRAVENOUS | Status: AC
  Filled 2015-05-16: qty 50

## 2015-05-16 MED ORDER — DEXAMETHASONE SODIUM PHOSPHATE 10 MG/ML IJ SOLN
INTRAMUSCULAR | Status: AC
  Filled 2015-05-16: qty 1

## 2015-05-16 MED ORDER — LIDOCAINE HCL (CARDIAC) 20 MG/ML IV SOLN
INTRAVENOUS | Status: AC
  Filled 2015-05-16: qty 5

## 2015-05-16 MED ORDER — ONDANSETRON HCL 4 MG/2ML IJ SOLN
INTRAMUSCULAR | Status: DC | PRN
  Administered 2015-05-16: 4 mg via INTRAVENOUS

## 2015-05-16 MED ORDER — BACITRACIN ZINC 500 UNIT/GM EX OINT
TOPICAL_OINTMENT | CUTANEOUS | Status: AC
  Filled 2015-05-16: qty 0.9

## 2015-05-16 MED ORDER — LACTATED RINGERS IV SOLN
INTRAVENOUS | Status: DC
Start: 2015-05-16 — End: 2015-05-16
  Administered 2015-05-16 (×3): via INTRAVENOUS

## 2015-05-16 MED ORDER — FENTANYL CITRATE (PF) 100 MCG/2ML IJ SOLN
25.0000 ug | INTRAMUSCULAR | Status: DC | PRN
  Administered 2015-05-16 (×2): 25 ug via INTRAVENOUS
  Administered 2015-05-16 (×2): 50 ug via INTRAVENOUS

## 2015-05-16 MED ORDER — DEXAMETHASONE SODIUM PHOSPHATE 4 MG/ML IJ SOLN
INTRAMUSCULAR | Status: DC | PRN
  Administered 2015-05-16: 5 mg via INTRAVENOUS

## 2015-05-16 MED ORDER — SCOPOLAMINE 1 MG/3DAYS TD PT72: 1.0000 | MEDICATED_PATCH | Freq: Once | TRANSDERMAL | Status: DC | PRN

## 2015-05-16 MED ORDER — BSS IO SOLN
INTRAOCULAR | Status: AC
  Filled 2015-05-16: qty 15

## 2015-05-16 MED ORDER — CLINDAMYCIN PHOSPHATE 600 MG/50ML IV SOLN
600.0000 mg | Freq: Once | INTRAVENOUS | Status: AC
  Administered 2015-05-16: 600 mg via INTRAVENOUS

## 2015-05-16 MED ORDER — LIDOCAINE-EPINEPHRINE 1 %-1:100000 IJ SOLN
INTRAMUSCULAR | Status: DC | PRN
  Administered 2015-05-16: 4 mL

## 2015-05-16 MED ORDER — OXYCODONE HCL 5 MG PO TABS: 5.0000 mg | ORAL_TABLET | Freq: Once | ORAL | Status: DC | PRN

## 2015-05-16 MED ORDER — LIDOCAINE HCL (CARDIAC) 20 MG/ML IV SOLN
INTRAVENOUS | Status: DC | PRN
  Administered 2015-05-16: 60 mg via INTRAVENOUS

## 2015-05-16 MED ORDER — MIDAZOLAM HCL 5 MG/5ML IJ SOLN
INTRAMUSCULAR | Status: DC | PRN
  Administered 2015-05-16: 1 mg via INTRAVENOUS

## 2015-05-16 MED ORDER — PROPOFOL 10 MG/ML IV BOLUS
INTRAVENOUS | Status: AC
  Filled 2015-05-16: qty 20

## 2015-05-16 SURGICAL SUPPLY — 54 items
APL SRG 3 HI ABS STRL LF PLS (MISCELLANEOUS) ×2
APPLICATOR COTTON TIP 6IN STRL (MISCELLANEOUS) IMPLANT
APPLICATOR DR MATTHEWS STRL (MISCELLANEOUS) ×4 IMPLANT
BLADE SURG 15 STRL LF DISP TIS (BLADE) ×2 IMPLANT
BLADE SURG 15 STRL SS (BLADE) ×8
BNDG COHESIVE 4X5 TAN STRL (GAUZE/BANDAGES/DRESSINGS) ×4 IMPLANT
BNDG GAUZE ELAST 4 BULKY (GAUZE/BANDAGES/DRESSINGS) ×4 IMPLANT
CANISTER SUCT 1200ML W/VALVE (MISCELLANEOUS) ×2 IMPLANT
CHLORAPREP W/TINT 26ML (MISCELLANEOUS) ×4 IMPLANT
COVER BACK TABLE 60X90IN (DRAPES) ×4 IMPLANT
COVER MAYO STAND STRL (DRAPES) ×4 IMPLANT
DRAPE SURG 17X23 STRL (DRAPES) ×4 IMPLANT
DRAPE U-SHAPE 76X120 STRL (DRAPES) ×4 IMPLANT
DRSG KUZMA FLUFF (GAUZE/BANDAGES/DRESSINGS) ×2 IMPLANT
ELECT COATED BLADE 2.86 ST (ELECTRODE) ×2 IMPLANT
ELECT NDL BLADE 2-5/6 (NEEDLE) ×2 IMPLANT
ELECT NEEDLE BLADE 2-5/6 (NEEDLE) ×4 IMPLANT
ELECT REM PT RETURN 9FT ADLT (ELECTROSURGICAL) ×4
ELECT REM PT RETURN 9FT PED (ELECTROSURGICAL)
ELECTRODE REM PT RETRN 9FT PED (ELECTROSURGICAL) IMPLANT
ELECTRODE REM PT RTRN 9FT ADLT (ELECTROSURGICAL) ×2 IMPLANT
GLOVE BIO SURGEON STRL SZ 6 (GLOVE) ×4 IMPLANT
GLOVE BIOGEL PI IND STRL 6.5 (GLOVE) IMPLANT
GLOVE BIOGEL PI IND STRL 7.5 (GLOVE) IMPLANT
GLOVE BIOGEL PI INDICATOR 6.5 (GLOVE) ×2
GLOVE BIOGEL PI INDICATOR 7.5 (GLOVE) ×2
GLOVE SURG SS PI 7.5 STRL IVOR (GLOVE) ×2 IMPLANT
GOWN STRL REUS W/ TWL LRG LVL3 (GOWN DISPOSABLE) ×4 IMPLANT
GOWN STRL REUS W/TWL LRG LVL3 (GOWN DISPOSABLE) ×8
LIQUID BAND (GAUZE/BANDAGES/DRESSINGS) IMPLANT
NDL HYPO 25X1 1.5 SAFETY (NEEDLE) IMPLANT
NDL PRECISIONGLIDE 27X1.5 (NEEDLE) ×2 IMPLANT
NEEDLE HYPO 25X1 1.5 SAFETY (NEEDLE) IMPLANT
NEEDLE PRECISIONGLIDE 27X1.5 (NEEDLE) ×4 IMPLANT
NS IRRIG 1000ML POUR BTL (IV SOLUTION) ×2 IMPLANT
PACK BASIN DAY SURGERY FS (CUSTOM PROCEDURE TRAY) ×4 IMPLANT
PENCIL BUTTON HOLSTER BLD 10FT (ELECTRODE) ×4 IMPLANT
SHEILD EYE MED CORNL SHD 22X21 (OPHTHALMIC RELATED) ×8
SHIELD EYE MED CORNL SHD 22X21 (OPHTHALMIC RELATED) ×4 IMPLANT
SLEEVE SCD COMPRESS KNEE MED (MISCELLANEOUS) ×4 IMPLANT
SOLUTION ANTI FOG 6CC (MISCELLANEOUS) IMPLANT
SPONGE GAUZE 4X4 12PLY STER LF (GAUZE/BANDAGES/DRESSINGS) IMPLANT
SUCTION FRAZIER TIP 10 FR DISP (SUCTIONS) IMPLANT
SUT CHROMIC 4 0 P 3 18 (SUTURE) IMPLANT
SUT PLAIN 5 0 P 3 18 (SUTURE) ×4 IMPLANT
SUT PROLENE 5 0 P 3 (SUTURE) ×2 IMPLANT
SUT PROLENE 6 0 PC 1 (SUTURE) ×2 IMPLANT
SUT VIC AB 5-0 P-3 18X BRD (SUTURE) ×2 IMPLANT
SUT VIC AB 5-0 P3 18 (SUTURE) ×12
SYR CONTROL 10ML LL (SYRINGE) ×4 IMPLANT
TOWEL OR 17X24 6PK STRL BLUE (TOWEL DISPOSABLE) ×4 IMPLANT
TRAY DSU PREP LF (CUSTOM PROCEDURE TRAY) ×2 IMPLANT
TUBE CONNECTING 20'X1/4 (TUBING) ×1
TUBE CONNECTING 20X1/4 (TUBING) ×3 IMPLANT

## 2015-05-16 NOTE — Discharge Instructions (Signed)

## 2015-05-16 NOTE — Transfer of Care (Signed)
Immediate Anesthesia Transfer of Care Note  Patient: Cheyenne Montgomery  Procedure(s) Performed: Procedure(s): BILATERAL EYELID PTOSIS REPAIR (Bilateral) PRE HAIRLINE BROW LIFT (Bilateral)  Patient Location: PACU  Anesthesia Type:General  Level of Consciousness: awake and patient cooperative  Airway & Oxygen Therapy: Patient Spontanous Breathing and Patient connected to face mask oxygen  Post-op Assessment: Report given to RN and Post -op Vital signs reviewed and stable  Post vital signs: Reviewed and stable  Last Vitals:  Filed Vitals:   05/16/15 0651  BP: 124/47  Pulse: 64  Temp: 36.4 C  Resp: 16    Complications: No apparent anesthesia complications

## 2015-05-16 NOTE — Op Note (Signed)
Operative Note   DATE OF OPERATION: 11.18.16  LOCATION: [place]- outpatient  SURGICAL DIVISION: Plastic Surgery  PREOPERATIVE DIAGNOSES:  1. Bilateral eyelid ptosis 2. Brow ptosis bilateral 3. Obstructed visual fields  POSTOPERATIVE DIAGNOSES:  same  PROCEDURE:  1. Prehairline direct brow lift bilateral 2. Bilateral eyelid ptosis repair with suture technique  SURGEON: Irene Limbo MD MBA  ASSISTANT: none  ANESTHESIA:  General.   EBL: minimal  COMPLICATIONS: None.   INDICATIONS FOR PROCEDURE:  The patient, Cheyenne Montgomery, is a 79 y.o. female born on 26-Nov-1934, is here for correction of bilateral eyelid ptosis and brow ptosis with resultant obstructed visual fields.    FINDINGS: Patient presented with bilateral eyelid ptosis, more severe on right, with adequate levator function. Levator advancement and plication completed.  DESCRIPTION OF PROCEDURE:  The patient's anterior hairline and desired amount of skin resection for brow lift were marked in the preoperative area in upright sitting position. The patient was taken to the operating room. SCDs were placed and IV antibiotics were given. Corneal shields and ophthalmic lubricant were placed bilateral. The patient's operative site was prepped and draped in a sterile fashion. A time out was performed and all information was confirmed to be correct. Local anesthetic was infiltrated to perform bilateral supraorbital nerve blocks. Additional local infiltrated along hairline excision. Sharp excision at anterior hairline completed from temple to temple with care taken to bevel cut and preserve hair follicles. Patient had pre existing thinning and recession of hairline. The area marked for resection preoperatively was completed and closure completed with interrupted 5-0 vicryl in dermis. The patient was brought to sitting position and assessed for symmetry. Addition skin resected to improve symmetry brow position. Closure at hairline  completed with short running 5-0 plain gut suture  I then directed attention to eyelids. Caudal skin incision marked at 7 mm from lash line at level of pupil and lateral canthus bilateral. 5 mm skin excision marked. I began on right. Skin excised and orbicularis divided superior tarsal plate and anterior surface tarsal plate exposed. Dissection completed superiorly beneath orbicularis oculi to expose levator aponeurosis. 5-0 vicryl suture placed over anterior surface of tarsal plate lateral to pupillary line and sutured to levator aponeurosis. Care taken to ensure sutures through tarsal plate not full thickness and no suture material exposed along conjunctiva. Additional suture placed medial and lateral to this central stitch in similar manner. The corneal shield removed and resting position of lid examined. The shield was replaced and I then performed similar skin excision and exposure tarsal plate and levator aponeurosis on left. Central suture at pupillary line from tarsal plate to aponeurosis completed. Additional plication sutures performed medial and lateral to this to ensure appropriate contour of upper lid. Corneal shields removed and patient brought to sitting position and assessed for symmetric position of upper lids. Patient returned to supine position, shields replaced and closure completed with running 6-0 prolene on each side. .   Corneal shields removed and eyes irrigated with basic salt solution. Tobradex ophthalmic placed in each eye. Bacitracin ointment placed along hairline scar. The patient was allowed to wake from anesthesia, extubated and taken to the recovery room in satisfactory condition.   SPECIMENS: none  DRAINS: none  Irene Limbo, MD [facility] Plastic & Reconstructive Surgery [phone]

## 2015-05-16 NOTE — Interval H&P Note (Signed)
History and Physical Interval Note:  05/16/2015 7:03 AM  Cheyenne Montgomery  has presented today for surgery, with the diagnosis of bilateral eyelid ptosis and bilateral brown ptosis  The various methods of treatment have been discussed with the patient and family. After consideration of risks, benefits and other options for treatment, the patient has consented to  Procedure(s): BILATERAL EYELID PTOSIS REPAIR (Bilateral) PRE HAIRLINE BROW LIFT (Bilateral) as a surgical intervention .  The patient's history has been reviewed, patient examined, no change in status, stable for surgery.  I have reviewed the patient's chart and labs.  Questions were answered to the patient's satisfaction.     Latori Beggs

## 2015-05-16 NOTE — Anesthesia Preprocedure Evaluation (Addendum)
Anesthesia Evaluation  Patient identified by MRN, date of birth, ID band Patient awake    Reviewed: Allergy & Precautions, NPO status , Patient's Chart, lab work & pertinent test results  Airway Mallampati: II   Neck ROM: full    Dental   Pulmonary former smoker,    breath sounds clear to auscultation       Cardiovascular negative cardio ROS   Rhythm:regular Rate:Normal     Neuro/Psych Anxiety    GI/Hepatic   Endo/Other    Renal/GU      Musculoskeletal  (+) Arthritis ,   Abdominal   Peds  Hematology   Anesthesia Other Findings   Reproductive/Obstetrics                            Anesthesia Physical Anesthesia Plan  ASA: II  Anesthesia Plan: MAC   Post-op Pain Management:    Induction: Intravenous  Airway Management Planned: Nasal Cannula  Additional Equipment:   Intra-op Plan:   Post-operative Plan:   Informed Consent: I have reviewed the patients History and Physical, chart, labs and discussed the procedure including the risks, benefits and alternatives for the proposed anesthesia with the patient or authorized representative who has indicated his/her understanding and acceptance.     Plan Discussed with: CRNA, Anesthesiologist and Surgeon  Anesthesia Plan Comments:         Anesthesia Quick Evaluation

## 2015-05-16 NOTE — Anesthesia Postprocedure Evaluation (Signed)
Anesthesia Post Note  Patient: Cheyenne Montgomery  Procedure(s) Performed: Procedure(s) (LRB): BILATERAL EYELID PTOSIS REPAIR (Bilateral) PRE HAIRLINE BROW LIFT (Bilateral)  Anesthesia type: General  Patient location: PACU  Post pain: Pain level controlled and Adequate analgesia  Post assessment: Post-op Vital signs reviewed, Patient's Cardiovascular Status Stable, Respiratory Function Stable, Patent Airway and Pain level controlled  Last Vitals:  Filed Vitals:   05/16/15 1000  BP: 136/54  Pulse: 66  Temp:   Resp: 13    Post vital signs: Reviewed and stable  Level of consciousness: awake, alert  and oriented  Complications: No apparent anesthesia complications

## 2015-05-16 NOTE — Anesthesia Procedure Notes (Signed)
Procedure Name: LMA Insertion Performed by: Tawni Millers Pre-anesthesia Checklist: Patient identified, Emergency Drugs available, Suction available and Patient being monitored Patient Re-evaluated:Patient Re-evaluated prior to inductionOxygen Delivery Method: Circle System Utilized Preoxygenation: Pre-oxygenation with 100% oxygen Intubation Type: IV induction Ventilation: Mask ventilation without difficulty LMA: LMA flexible inserted LMA Size: 3.0 Number of attempts: 1 Airway Equipment and Method: Bite block Placement Confirmation: positive ETCO2 Tube secured with: Tape Dental Injury: Teeth and Oropharynx as per pre-operative assessment

## 2015-05-19 ENCOUNTER — Encounter (HOSPITAL_BASED_OUTPATIENT_CLINIC_OR_DEPARTMENT_OTHER): Payer: Self-pay | Admitting: Plastic Surgery

## 2016-03-15 ENCOUNTER — Encounter (HOSPITAL_COMMUNITY): Payer: Self-pay | Admitting: Emergency Medicine

## 2016-03-15 DIAGNOSIS — M79604 Pain in right leg: Secondary | ICD-10-CM | POA: Diagnosis not present

## 2016-03-15 DIAGNOSIS — Z87891 Personal history of nicotine dependence: Secondary | ICD-10-CM | POA: Diagnosis not present

## 2016-03-15 DIAGNOSIS — Z79899 Other long term (current) drug therapy: Secondary | ICD-10-CM | POA: Diagnosis not present

## 2016-03-15 DIAGNOSIS — M7989 Other specified soft tissue disorders: Secondary | ICD-10-CM | POA: Insufficient documentation

## 2016-03-15 LAB — D-DIMER, QUANTITATIVE: D-Dimer, Quant: 0.38 ug/mL-FEU (ref 0.00–0.50)

## 2016-03-15 LAB — CBC
HEMATOCRIT: 41.8 % (ref 36.0–46.0)
Hemoglobin: 13.6 g/dL (ref 12.0–15.0)
MCH: 30.6 pg (ref 26.0–34.0)
MCHC: 32.5 g/dL (ref 30.0–36.0)
MCV: 93.9 fL (ref 78.0–100.0)
PLATELETS: 191 10*3/uL (ref 150–400)
RBC: 4.45 MIL/uL (ref 3.87–5.11)
RDW: 13.7 % (ref 11.5–15.5)
WBC: 7 10*3/uL (ref 4.0–10.5)

## 2016-03-15 NOTE — ED Triage Notes (Signed)
Pt c/o right leg pain for the past few days. Pt states it started out as "heat in my right leg." Pt is afraid of possible blood clot. Visible varicose veins to both legs but pt states her right leg is swollen and "looks a little different than usual." Cap refill less than 3 secs to toes.

## 2016-03-16 ENCOUNTER — Emergency Department (HOSPITAL_COMMUNITY)
Admission: EM | Admit: 2016-03-16 | Discharge: 2016-03-16 | Disposition: A | Discharge status: Home / Self Care | Payer: Medicare Other | Attending: Emergency Medicine | Admitting: Emergency Medicine

## 2016-03-16 DIAGNOSIS — M79604 Pain in right leg: Secondary | ICD-10-CM

## 2016-03-16 LAB — BASIC METABOLIC PANEL
ANION GAP: 8 (ref 5–15)
BUN: 16 mg/dL (ref 6–20)
CALCIUM: 9.5 mg/dL (ref 8.9–10.3)
CO2: 28 mmol/L (ref 22–32)
CREATININE: 0.68 mg/dL (ref 0.44–1.00)
Chloride: 104 mmol/L (ref 101–111)
Glucose, Bld: 108 mg/dL — ABNORMAL HIGH (ref 65–99)
Potassium: 3.8 mmol/L (ref 3.5–5.1)
SODIUM: 140 mmol/L (ref 135–145)

## 2016-03-16 NOTE — ED Provider Notes (Addendum)
[facility] DEPT Provider Note   CSN: [id number] Arrival date & time: 03/15/16  2111   By signing my name below, I, Royce Macadamia, attest that this documentation has been prepared under the direction and in the presence of Veryl Speak, MD . Electronically Signed: Royce Macadamia, Scribe. 03/16/2016. 1:02 AM.  History   Chief Complaint Chief Complaint  Patient presents with  . Leg Pain   The history is provided by the patient and medical records. No language interpreter was used.     HPI Comments:  Cheyenne Montgomery is a 81 y.o. female who presents to the Emergency Department complaining of gradually worsening right calf pain beginning earlier today.  She reports that it feels like there is a "heat" inside of her right leg.  She also notes pain that began in her knee, but has moved to her calf; her pain causes her to limp.  She denies history of infection, cellulitis and problems with her right leg apart from chronic knee pain.  She will see her PCP later this week.  No alleviating factors noted.  No additional symptoms or complaints.    Past Medical History:  Diagnosis Date  . Anxiety   . Arthritis     Patient Active Problem List   Diagnosis Date Noted  . Spondylolisthesis at L4-L5 level 04/20/2014    Past Surgical History:  Procedure Laterality Date  . ABDOMINAL HYSTERECTOMY    . APPENDECTOMY    . BROW LIFT Bilateral 05/16/2015   Procedure: PRE HAIRLINE BROW LIFT;  Surgeon: Irene Limbo, MD;  Location: [place];  Service: Plastics;  Laterality: Bilateral;  . PTOSIS REPAIR Bilateral 05/16/2015   Procedure: BILATERAL EYELID PTOSIS REPAIR;  Surgeon: Irene Limbo, MD;  Location: [facility];  Service: Plastics;  Laterality: Bilateral;    OB History    No data available       Home Medications    Prior to Admission medications   Medication Sig Start Date End Date Taking? Authorizing Provider  ALPRAZolam (XANAX) 0.25 MG  tablet Take 0.25 mg by mouth 2 (two) times daily as needed for anxiety.    Historical Provider, MD  calcium carbonate (OS-CAL) 1250 (500 CA) MG chewable tablet Chew 1 tablet by mouth daily.    Historical Provider, MD  estradiol (ESTRACE) 1 MG tablet Take 1 mg by mouth daily.    Historical Provider, MD  estrogens, conjugated, (PREMARIN) 0.625 MG tablet Take 0.625 mg by mouth daily. Take daily for 21 days then do not take for 7 days.    Historical Provider, MD  Garlic 123XX123 MG CAPS Take by mouth.    Historical Provider, MD  glucosamine-chondroitin 500-400 MG tablet Take 1 tablet by mouth 3 (three) times daily.    Historical Provider, MD  Mouthwashes (BIOTENE/CALCIUM MT) Use as directed in the mouth or throat.    Historical Provider, MD  Multiple Vitamins-Calcium (VIACTIV MULTI-VITAMIN PO) Take by mouth.    Historical Provider, MD  vitamin C (ASCORBIC ACID) 500 MG tablet Take 500 mg by mouth daily.    Historical Provider, MD  vitamin E (VITAMIN E) 400 UNIT capsule Take 400 Units by mouth daily.    Historical Provider, MD  Vitamins-Lipotropics (LIPOFLAVONOID PO) Take by mouth.    Historical Provider, MD    Family History Family History  Problem Relation Age of Onset  . Cancer Mother   . Heart disease Father   . Heart disease Brother     Social History Social History  Substance Use Topics  . Smoking status: Former Research scientist (life sciences)  . Smokeless tobacco: Never Used  . Alcohol use Yes     Comment: occas wine     Allergies   Doxycycline and Penicillins   Review of Systems Review of Systems  Cardiovascular: Positive for leg swelling.  Musculoskeletal: Positive for myalgias.  All other systems reviewed and are negative.    Physical Exam Updated Vital Signs BP 156/71 (BP Location: Left Arm)   Pulse 78   Temp 97.6 F (36.4 C) (Oral)   Resp 13   Ht 5' (1.524 m)   Wt 140 lb (63.5 kg)   SpO2 100%   BMI 27.34 kg/m   Physical Exam  Constitutional: She is oriented to person, place, and  time. She appears well-developed and well-nourished. No distress.  HENT:  Head: Normocephalic and atraumatic.  Neck: Normal range of motion. Neck supple.  Cardiovascular: Normal rate and regular rhythm.  Exam reveals no gallop and no friction rub.   No murmur heard. Pulmonary/Chest: Effort normal and breath sounds normal. No respiratory distress. She has no wheezes.  Abdominal: Soft. Bowel sounds are normal. She exhibits no distension. There is no tenderness.  Musculoskeletal: Normal range of motion.  There is no calf tenderness or swelling. Homan sign is absent bilaterally.  Neurological: She is alert and oriented to person, place, and time.  Skin: Skin is warm and dry. She is not diaphoretic.  Nursing note and vitals reviewed.    ED Treatments / Results   DIAGNOSTIC STUDIES:  Oxygen Saturation is 100% on RA, NML by my interpretation.    COORDINATION OF CARE:  1:02 AM Discussed treatment plan with pt at bedside and pt agreed to plan.  Labs (all labs ordered are listed, but only abnormal results are displayed) Labs Reviewed  BASIC METABOLIC PANEL - Abnormal; Notable for the following:       Result Value   Glucose, Bld 108 (*)    All other components within normal limits  CBC  D-DIMER, QUANTITATIVE (NOT AT [facility])    EKG  EKG Interpretation None       Radiology No results found.  Procedures Procedures (including critical care time)  Medications Ordered in ED Medications - No data to display   Initial Impression / Assessment and Plan / ED Course  I have reviewed the triage vital signs and the nursing notes.  Pertinent labs & imaging results that were available during my care of the patient were reviewed by me and considered in my medical decision making (see chart for details).  Clinical Course    Patient's d-dimer is negative. I have a low suspicion for DVT and feel as though this has been excluded with the above test. She will be discharged, to return as  needed for any problems.  Final Clinical Impressions(s) / ED Diagnoses   Final diagnoses:  None    New Prescriptions New Prescriptions   No medications on file   I personally performed the services described in this documentation, which was scribed in my presence. The recorded information has been reviewed and is accurate.        Veryl Speak, MD 03/16/16 [id number]    Veryl Speak, MD 04/29/16 1326

## 2016-03-16 NOTE — Discharge Instructions (Signed)
Return to the emergency department if you develop redness, increased pain, fever, or other new and concerning symptoms.  You can take ibuprofen 400 mg every 6 hours as needed for pain.

## 2016-08-09 ENCOUNTER — Ambulatory Visit (INDEPENDENT_AMBULATORY_CARE_PROVIDER_SITE_OTHER): Payer: Medicare Other | Admitting: Gynecology

## 2016-08-09 ENCOUNTER — Encounter: Payer: Self-pay | Admitting: Gynecology

## 2016-08-09 VITALS — BP 120/82 | Ht 59.0 in | Wt 147.0 lb

## 2016-08-09 DIAGNOSIS — Z01419 Encounter for gynecological examination (general) (routine) without abnormal findings: Secondary | ICD-10-CM

## 2016-08-09 NOTE — Progress Notes (Signed)
Cheyenne Montgomery Dec 30, 1934 [id number]   History:    82 y.o.  for annual gyn exam new patient to the practice. Patient stated that she has not had a gynecological examination in several years. She states that many years ago she had a total abdominal hysterectomy as a result of symptomatic leiomyomatous uteri but her ovaries were left in place. She has been on hormone replacement therapy for many years and has tried to come off of it but has been unable to. Her PCP Dr. Graciella Freer on Estrace 1 mg daily. Patient fully understands the risk and accepts and does not want to discontinue it. She had a bone density study several years ago stated that she did not have any osteoporosis in this not want to have another bone density study. She reports normal mammogram 2017. She did have a colonoscopy and benign polyps were removed approximate 5 years ago. Patient reports no abnormal Pap smears before her hysterectomy.  Past medical history,surgical history, family history and social history were all reviewed and documented in the EPIC chart.  Gynecologic History No LMP recorded. Patient is postmenopausal. Contraception: status post hysterectomy Last Pap: See above. Results were: See above Last mammogram: 2017. Results were: normal  Obstetric History OB History  Gravida Para Term Preterm AB Living  2       0 2  SAB TAB Ectopic Multiple Live Births      0        # Outcome Date GA Lbr Len/2nd Weight Sex Delivery Anes PTL Lv  2 Gravida           1 Gravida                ROS: A ROS was performed and pertinent positives and negatives are included in the history.  GENERAL: No fevers or chills. HEENT: No change in vision, no earache, sore throat or sinus congestion. NECK: No pain or stiffness. CARDIOVASCULAR: No chest pain or pressure. No palpitations. PULMONARY: No shortness of breath, cough or wheeze. GASTROINTESTINAL: No abdominal pain, nausea, vomiting or diarrhea, melena or bright red blood per  rectum. GENITOURINARY: No urinary frequency, urgency, hesitancy or dysuria. MUSCULOSKELETAL: No joint or muscle pain, no back pain, no recent trauma. DERMATOLOGIC: No rash, no itching, no lesions. ENDOCRINE: No polyuria, polydipsia, no heat or cold intolerance. No recent change in weight. HEMATOLOGICAL: No anemia or easy bruising or bleeding. NEUROLOGIC: No headache, seizures, numbness, tingling or weakness. PSYCHIATRIC: No depression, no loss of interest in normal activity or change in sleep pattern.     Exam: chaperone present  BP 120/82   Ht 4\' 11"  (1.499 m)   Wt 147 lb (66.7 kg)   BMI 29.69 kg/m   Body mass index is 29.69 kg/m.  General appearance : Well developed well nourished female. No acute distress HEENT: Eyes: no retinal hemorrhage or exudates,  Neck supple, trachea midline, no carotid bruits, no thyroidmegaly Lungs: Clear to auscultation, no rhonchi or wheezes, or rib retractions  Heart: Regular rate and rhythm, no murmurs or gallops Breast:Examined in sitting and supine position were symmetrical in appearance, no palpable masses or tenderness,  no skin retraction, no nipple inversion, no nipple discharge, no skin discoloration, no axillary or supraclavicular lymphadenopathy Abdomen: no palpable masses or tenderness, no rebound or guarding Extremities: no edema or skin discoloration or tenderness  Pelvic:  Bartholin, Urethra, Skene Glands: Within normal limits             Vagina: No  gross lesions or discharge  Cervix: Absent  Uterus  absent  Adnexa  Without masses or tenderness  Anus and perineum  normal   Rectovaginal  normal sphincter tone without palpated masses or tenderness             Hemoccult PCP provides     Assessment/Plan:  82 y.o. female for annual exam postmenopausal patient on Estrace 1 mg daily for many years refuses to discontinue it. The risk were discussed include DVT, pulmonary embolism and breast cancer. Patient has stated that she has attempted to  discontinue medication several times in the past but cannot tolerate her vasomotor symptoms. Patient fully accepts risk. Pap smear not indicated. Patient to discuss with her PCP her next colonoscopy. Patient declined bone density study. Her vaccines are up-to-date as well as her labs by her PCP. Patient no longer needs Pap smears according to the new guidelines.   Terrance Mass MD, 12:06 PM 08/09/2016

## 2016-11-10 ENCOUNTER — Encounter: Payer: Self-pay | Admitting: Gynecology

## 2017-07-11 ENCOUNTER — Encounter (INDEPENDENT_AMBULATORY_CARE_PROVIDER_SITE_OTHER): Payer: Medicare Other | Admitting: Ophthalmology

## 2017-07-11 DIAGNOSIS — H353112 Nonexudative age-related macular degeneration, right eye, intermediate dry stage: Secondary | ICD-10-CM

## 2017-07-11 DIAGNOSIS — H353121 Nonexudative age-related macular degeneration, left eye, early dry stage: Secondary | ICD-10-CM

## 2017-07-11 DIAGNOSIS — H43813 Vitreous degeneration, bilateral: Secondary | ICD-10-CM

## 2017-09-30 ENCOUNTER — Other Ambulatory Visit: Payer: Self-pay

## 2017-09-30 ENCOUNTER — Emergency Department (HOSPITAL_COMMUNITY): Payer: Medicare Other

## 2017-09-30 ENCOUNTER — Encounter (HOSPITAL_COMMUNITY): Payer: Self-pay | Admitting: *Deleted

## 2017-09-30 ENCOUNTER — Emergency Department (HOSPITAL_COMMUNITY)
Admission: EM | Admit: 2017-09-30 | Discharge: 2017-09-30 | Disposition: A | Discharge status: Home / Self Care | Payer: Medicare Other | Attending: Emergency Medicine | Admitting: Emergency Medicine

## 2017-09-30 DIAGNOSIS — R072 Precordial pain: Secondary | ICD-10-CM | POA: Diagnosis not present

## 2017-09-30 DIAGNOSIS — Z7982 Long term (current) use of aspirin: Secondary | ICD-10-CM | POA: Insufficient documentation

## 2017-09-30 DIAGNOSIS — Z79899 Other long term (current) drug therapy: Secondary | ICD-10-CM | POA: Insufficient documentation

## 2017-09-30 DIAGNOSIS — F419 Anxiety disorder, unspecified: Secondary | ICD-10-CM | POA: Insufficient documentation

## 2017-09-30 DIAGNOSIS — Z87891 Personal history of nicotine dependence: Secondary | ICD-10-CM | POA: Diagnosis not present

## 2017-09-30 DIAGNOSIS — R079 Chest pain, unspecified: Secondary | ICD-10-CM | POA: Diagnosis present

## 2017-09-30 LAB — BASIC METABOLIC PANEL
Anion gap: 12 (ref 5–15)
BUN: 14 mg/dL (ref 6–20)
CO2: 26 mmol/L (ref 22–32)
Calcium: 9.7 mg/dL (ref 8.9–10.3)
Chloride: 101 mmol/L (ref 101–111)
Creatinine, Ser: 0.74 mg/dL (ref 0.44–1.00)
GFR calc Af Amer: >60 mL/min (ref >60)
GFR calc non Af Amer: >60 mL/min (ref >60)
Glucose, Bld: 119 mg/dL — ABNORMAL HIGH (ref 65–99)
Potassium: 3.9 mmol/L (ref 3.5–5.1)
Sodium: 139 mmol/L (ref 135–145)

## 2017-09-30 LAB — I-STAT TROPONIN, ED
Troponin i, poc: 0 ng/mL (ref 0.00–0.08)
Troponin i, poc: 0 ng/mL (ref 0.00–0.08)

## 2017-09-30 LAB — CBC
HEMATOCRIT: 38.2 % (ref 36.0–46.0)
HEMOGLOBIN: 12.4 g/dL (ref 12.0–15.0)
MCH: 31.1 pg (ref 26.0–34.0)
MCHC: 32.5 g/dL (ref 30.0–36.0)
MCV: 95.7 fL (ref 78.0–100.0)
Platelets: 184 10*3/uL (ref 150–400)
RBC: 3.99 MIL/uL (ref 3.87–5.11)
RDW: 13.4 % (ref 11.5–15.5)
WBC: 7.1 10*3/uL (ref 4.0–10.5)

## 2017-09-30 MED ORDER — FAMOTIDINE 20 MG PO TABS: 20.0000 mg | ORAL_TABLET | Freq: Two times a day (BID) | ORAL | 0 refills | Status: AC

## 2017-09-30 MED ORDER — FAMOTIDINE 20 MG PO TABS
40.0000 mg | ORAL_TABLET | Freq: Once | ORAL | Status: AC
  Administered 2017-09-30: 40 mg via ORAL
  Filled 2017-09-30: qty 2

## 2017-09-30 NOTE — ED Triage Notes (Addendum)
PT here from home via [facility] for chest pain.  Pain improved slightly with asa (324 mg at 1130), but did not decrease with the 1 nitro given by [facility].  EKG NSR, bp 138/68, hr 72, rr 18, Sats 98% RA.  States generalized, intermittent abdominal pain since she had a colonoscopy several days ago. Pt states reaction to "post colonoscopy" med she took yesterday.

## 2017-09-30 NOTE — Discharge Instructions (Signed)
1.  Return immediately if you develop recurrent chest pain, shortness of breath, nausea, lightheadedness or other concerning symptoms. 2.  Follow-up with your doctor next week to discuss stress testing. 3.  Take Pepcid twice daily.  Follow dietary instructions for reflux.  Take a daily baby aspirin.

## 2017-09-30 NOTE — ED Notes (Signed)
Patient verbalizes understanding of discharge instructions. Opportunity for questioning and answers were provided. Armband removed by staff, pt discharged from ED.  

## 2017-09-30 NOTE — ED Provider Notes (Signed)
[facility] EMERGENCY DEPARTMENT Provider Note   CSN: [id number] Arrival date & time: 09/30/17  1236     History   Chief Complaint Chief Complaint  Patient presents with  . Chest Pain    HPI Cheyenne Montgomery is a 83 y.o. female.  HPI Patient reports she had a colonoscopy 3 days ago.  It was a routine scheduled colonoscopy.  She states she was told she had some colitis and was given a prescription for mesalamine.  She states after she tried that she thought she had an allergic reaction.  She reports her face and tongue seemed swollen.  She reports she was having some lower abdominal cramping discomfort.  She followed up with Dr. Cristina Gong yesterday regarding these findings.  She did not repeat the medication.  She states that today she developed a fairly acute onset of some lower abdominal discomfort but mostly central chest pain that had a tightness discomfort quality to it.  No associated shortness of breath, radiation, lightheadedness or diaphoresis.  She reports she was concerned about the symptoms being due to her heart and was advised to call EMS by her doctor.  EMS administered a nitroglycerin which reportedly did not seem to improve the pain.  4 aspirin may have helped slightly she reports.  She reports after arrival to the emergency department the pain started to gradually go away.  She denies any pain at this time.  She reports she suspects it was because of the medication that she tried yesterday.  She denies routinely taking medications for reflux or gastritis.  No history of coronary artery disease.  No history of PE or DVT. Past Medical History:  Diagnosis Date  . Anxiety   . Arthritis     Patient Active Problem List   Diagnosis Date Noted  . Spondylolisthesis at L4-L5 level 04/20/2014    Past Surgical History:  Procedure Laterality Date  . ABDOMINAL HYSTERECTOMY    . APPENDECTOMY    . BROW LIFT Bilateral 05/16/2015   Procedure: PRE HAIRLINE BROW LIFT;   Surgeon: Irene Limbo, MD;  Location: [facility];  Service: Plastics;  Laterality: Bilateral;  . PTOSIS REPAIR Bilateral 05/16/2015   Procedure: BILATERAL EYELID PTOSIS REPAIR;  Surgeon: Irene Limbo, MD;  Location: [facility];  Service: Plastics;  Laterality: Bilateral;     OB History    Gravida  2   Para      Term      Preterm      AB  0   Living  2     SAB      TAB      Ectopic  0   Multiple      Live Births               Home Medications    Prior to Admission medications   Medication Sig Start Date End Date Taking? Authorizing Provider  ALPRAZolam (XANAX) 0.25 MG tablet Take 0.25 mg by mouth daily as needed for anxiety (holiday stress).    Yes [provider]  aspirin EC 81 MG tablet Take 81 mg by mouth daily.   Yes [provider]  calcium carbonate (OS-CAL) 1250 (500 CA) MG chewable tablet Chew 1 tablet by mouth daily.   Yes [provider]  estradiol (ESTRACE) 1 MG tablet Take 1 mg by mouth daily.   Yes [provider]  Garlic 0347 MG CAPS Take 1,000 mg by mouth daily.  Yes [provider]  ibuprofen (ADVIL,MOTRIN) 200 MG tablet Take 200 mg by mouth daily as needed for headache (pain).   Yes [provider]  Multiple Vitamins-Calcium (VIACTIV MULTI-VITAMIN PO) Take 1 tablet by mouth daily.    Yes [provider]  olopatadine (PATANOL) 0.1 % ophthalmic solution Place 1 drop into both eyes 2 (two) times daily as needed (seasonal allergies).   Yes [provider]  vitamin C (ASCORBIC ACID) 500 MG tablet Take 500 mg by mouth daily.   Yes [provider]  vitamin E (VITAMIN E) 400 UNIT capsule Take 400 Units by mouth daily.   Yes [provider]  Vitamins-Lipotropics (LIPOFLAVONOID PO) Take 1 tablet by mouth daily.    Yes [provider]  famotidine (PEPCID) 20 MG tablet Take 1 tablet (20 mg total) by mouth 2 (two) times daily.  09/30/17   Charlesetta Shanks, MD    Family History Family History  Problem Relation Age of Onset  . Cancer Mother   . Heart disease Father   . Heart disease Brother     Social History Social History   Tobacco Use  . Smoking status: Former Research scientist (life sciences)  . Smokeless tobacco: Never Used  Substance Use Topics  . Alcohol use: Yes    Comment: occas wine  . Drug use: No     Allergies   Mesalamine; Doxycycline; and Penicillins   Review of Systems Review of Systems 10 Systems reviewed and are negative for acute change except as noted in the HPI.   Physical Exam Updated Vital Signs BP 127/84   Pulse (!) 59   Temp 98.4 F (36.9 C) (Oral)   Resp (!) 21   Ht 5' (1.524 m)   Wt 65.8 kg (145 lb)   SpO2 99%   BMI 28.32 kg/m   Physical Exam  Constitutional: She is oriented to person, place, and time. She appears well-developed and well-nourished. No distress.  HENT:  Head: Normocephalic and atraumatic.  Mouth/Throat: Oropharynx is clear and moist.  Eyes: Pupils are equal, round, and reactive to light. EOM are normal.  Cardiovascular: Normal rate, regular rhythm, normal heart sounds and intact distal pulses.  Pulmonary/Chest: Breath sounds normal.  Abdominal: Soft. Bowel sounds are normal. She exhibits no distension. There is no tenderness. There is no guarding.  Musculoskeletal: Normal range of motion. She exhibits no edema or tenderness.  Neurological: She is alert and oriented to person, place, and time. No cranial nerve deficit. She exhibits normal muscle tone. Coordination normal.  Skin: Skin is warm and dry.  Psychiatric: She has a normal mood and affect.     ED Treatments / Results  Labs (all labs ordered are listed, but only abnormal results are displayed) Labs Reviewed  BASIC METABOLIC PANEL - Abnormal; Notable for the following components:      Result Value   Glucose, Bld 119 (*)    All other components within normal limits  CBC  HEPATIC FUNCTION PANEL  LIPASE,  BLOOD  I-STAT TROPONIN, ED  I-STAT TROPONIN, ED    EKG EKG Interpretation  Date/Time:  Friday September 30 2017 12:42:51 EDT Ventricular Rate:  70 PR Interval:  150 QRS Duration: 66 QT Interval:  404 QTC Calculation: 436 R Axis:   54 Text Interpretation:  Normal sinus rhythm Nonspecific ST abnormality Abnormal ECG no STEMI no sig  change from old Confirmed by Charlesetta Shanks [phone]) on 09/30/2017 2:59:48 PM   Radiology Dg Chest 2 View  Result Date: 09/30/2017 CLINICAL DATA:  Chest and abdominal pain for several hours EXAM: CHEST - 2 VIEW COMPARISON:  None. FINDINGS: The heart size and mediastinal contours are within normal limits. Both lungs are clear. The visualized skeletal structures are unremarkable. IMPRESSION: No active cardiopulmonary disease. Electronically Signed   By: Inez Catalina M.D.   On: 09/30/2017 13:20    Procedures Procedures (including critical care time)  Medications Ordered in ED Medications  famotidine (PEPCID) tablet 40 mg (40 mg Oral Given 09/30/17 1818)     Initial Impression / Assessment and Plan / ED Course  I have reviewed the triage vital signs and the nursing notes.  Pertinent labs & imaging results that were available during my care of the patient were reviewed by me and considered in my medical decision making (see chart for details).      Final Clinical Impressions(s) / ED Diagnoses   Final diagnoses:  Precordial chest pain   Patient developed chest pain without associated symptoms.  She also had some GI symptoms earlier as well.  Patient does not have cardiac history.  Patient does have advanced age as predominant risk factor.  2 sets of cardiac enzymes are negative.  Chest pain has completely resolved.  At this time I do feel patient is stable for continued outpatient evaluation.  She will take Pepcid twice daily and follow diet for reflux.  She is instructed to follow-up with her PCP this week to discuss repeat stress testing.  Return  precautions are reviewed. ED Discharge Orders        Ordered    famotidine (PEPCID) 20 MG tablet  2 times daily     09/30/17 1846       Charlesetta Shanks, MD 09/30/17 1851

## 2018-04-17 ENCOUNTER — Encounter (INDEPENDENT_AMBULATORY_CARE_PROVIDER_SITE_OTHER): Payer: Medicare Other | Admitting: Ophthalmology

## 2018-04-17 DIAGNOSIS — H43813 Vitreous degeneration, bilateral: Secondary | ICD-10-CM

## 2018-04-17 DIAGNOSIS — H353122 Nonexudative age-related macular degeneration, left eye, intermediate dry stage: Secondary | ICD-10-CM

## 2018-04-17 DIAGNOSIS — H353211 Exudative age-related macular degeneration, right eye, with active choroidal neovascularization: Secondary | ICD-10-CM

## 2018-05-15 ENCOUNTER — Encounter (INDEPENDENT_AMBULATORY_CARE_PROVIDER_SITE_OTHER): Payer: Medicare Other | Admitting: Ophthalmology

## 2018-05-15 DIAGNOSIS — H353122 Nonexudative age-related macular degeneration, left eye, intermediate dry stage: Secondary | ICD-10-CM | POA: Diagnosis not present

## 2018-05-15 DIAGNOSIS — H353211 Exudative age-related macular degeneration, right eye, with active choroidal neovascularization: Secondary | ICD-10-CM | POA: Diagnosis not present

## 2018-05-15 DIAGNOSIS — H43813 Vitreous degeneration, bilateral: Secondary | ICD-10-CM | POA: Diagnosis not present

## 2018-05-16 ENCOUNTER — Encounter (INDEPENDENT_AMBULATORY_CARE_PROVIDER_SITE_OTHER): Payer: Medicare Other | Admitting: Ophthalmology

## 2018-06-12 ENCOUNTER — Encounter (INDEPENDENT_AMBULATORY_CARE_PROVIDER_SITE_OTHER): Payer: Medicare Other | Admitting: Ophthalmology

## 2018-06-12 DIAGNOSIS — H353211 Exudative age-related macular degeneration, right eye, with active choroidal neovascularization: Secondary | ICD-10-CM

## 2018-06-12 DIAGNOSIS — H43813 Vitreous degeneration, bilateral: Secondary | ICD-10-CM

## 2018-06-12 DIAGNOSIS — H353122 Nonexudative age-related macular degeneration, left eye, intermediate dry stage: Secondary | ICD-10-CM

## 2018-07-19 ENCOUNTER — Encounter (INDEPENDENT_AMBULATORY_CARE_PROVIDER_SITE_OTHER): Payer: Medicare Other | Admitting: Ophthalmology

## 2018-07-19 DIAGNOSIS — H353122 Nonexudative age-related macular degeneration, left eye, intermediate dry stage: Secondary | ICD-10-CM

## 2018-07-19 DIAGNOSIS — H43813 Vitreous degeneration, bilateral: Secondary | ICD-10-CM | POA: Diagnosis not present

## 2018-07-19 DIAGNOSIS — H353211 Exudative age-related macular degeneration, right eye, with active choroidal neovascularization: Secondary | ICD-10-CM

## 2018-08-28 ENCOUNTER — Encounter (INDEPENDENT_AMBULATORY_CARE_PROVIDER_SITE_OTHER): Payer: Medicare Other | Admitting: Ophthalmology

## 2018-08-28 DIAGNOSIS — H43813 Vitreous degeneration, bilateral: Secondary | ICD-10-CM | POA: Diagnosis not present

## 2018-08-28 DIAGNOSIS — H353122 Nonexudative age-related macular degeneration, left eye, intermediate dry stage: Secondary | ICD-10-CM

## 2018-08-28 DIAGNOSIS — H353211 Exudative age-related macular degeneration, right eye, with active choroidal neovascularization: Secondary | ICD-10-CM

## 2018-09-12 ENCOUNTER — Encounter: Payer: Self-pay | Admitting: Obstetrics & Gynecology

## 2018-09-12 ENCOUNTER — Other Ambulatory Visit: Payer: Self-pay

## 2018-09-12 ENCOUNTER — Ambulatory Visit (INDEPENDENT_AMBULATORY_CARE_PROVIDER_SITE_OTHER): Payer: Medicare Other | Admitting: Obstetrics & Gynecology

## 2018-09-12 VITALS — BP 128/78 | Ht 58.5 in | Wt 145.0 lb

## 2018-09-12 DIAGNOSIS — Z13820 Encounter for screening for osteoporosis: Secondary | ICD-10-CM

## 2018-09-12 DIAGNOSIS — Z01419 Encounter for gynecological examination (general) (routine) without abnormal findings: Secondary | ICD-10-CM | POA: Diagnosis not present

## 2018-09-12 DIAGNOSIS — Z780 Asymptomatic menopausal state: Secondary | ICD-10-CM | POA: Diagnosis not present

## 2018-09-12 NOTE — Progress Notes (Signed)
Cheyenne Montgomery [birth year] [id number]   History:    84 y.o. G2P2L2  RP:  Established patient presenting for annual gyn exam   HPI: S/P TAH for Fibroids.  Longstanding menopause on no hormone replacement therapy.  No pelvic pain.  Abstinent.  Urine and bowel movements normal.  Breasts normal.  Body mass index 29.79.  Patient continues to be very active physically.  Health labs with family physician.  Past medical history,surgical history, family history and social history were all reviewed and documented in the EPIC chart.  Gynecologic History No LMP recorded. Patient is postmenopausal. Contraception: status post hysterectomy Last Pap: 2017. Results wereNo : Negative Last mammogram: 2018. Results were:  Normal Bone Density: No recent BD Colonoscopy: 2019  Obstetric History OB History  Gravida Para Term Preterm AB Living  2       0 2  SAB TAB Ectopic Multiple Live Births      0        # Outcome Date GA Lbr Len/2nd Weight Sex Delivery Anes PTL Lv  2 Gravida           1 Gravida              ROS: A ROS was performed and pertinent positives and negatives are included in the history.  GENERAL: No fevers or chills. HEENT: No change in vision, no earache, sore throat or sinus congestion. NECK: No pain or stiffness. CARDIOVASCULAR: No chest pain or pressure. No palpitations. PULMONARY: No shortness of breath, cough or wheeze. GASTROINTESTINAL: No abdominal pain, nausea, vomiting or diarrhea, melena or bright red blood per rectum. GENITOURINARY: No urinary frequency, urgency, hesitancy or dysuria. MUSCULOSKELETAL: No joint or muscle pain, no back pain, no recent trauma. DERMATOLOGIC: No rash, no itching, no lesions. ENDOCRINE: No polyuria, polydipsia, no heat or cold intolerance. No recent change in weight. HEMATOLOGICAL: No anemia or easy bruising or bleeding. NEUROLOGIC: No headache, seizures, numbness, tingling or weakness. PSYCHIATRIC: No depression, no loss of interest in normal  activity or change in sleep pattern.     Exam:   BP 128/78    Ht 4' 10.5" (1.486 m)    Wt 145 lb (65.8 kg)    BMI 29.79 kg/m   Body mass index is 29.79 kg/m.  General appearance : Well developed well nourished female. No acute distress HEENT: Eyes: no retinal hemorrhage or exudates,  Neck supple, trachea midline, no carotid bruits, no thyroidmegaly Lungs: Clear to auscultation, no rhonchi or wheezes, or rib retractions  Heart: Regular rate and rhythm, no murmurs or gallops Breast:Examined in sitting and supine position were symmetrical in appearance, no palpable masses or tenderness,  no skin retraction, no nipple inversion, no nipple discharge, no skin discoloration, no axillary or supraclavicular lymphadenopathy Abdomen: no palpable masses or tenderness, no rebound or guarding Extremities: no edema or skin discoloration or tenderness  Pelvic: Vulva: Normal             Vagina: No gross lesions or discharge  Cervix/Uterus absent  Adnexa  Without masses or tenderness  Anus: Normal   Assessment/Plan:  84 y.o. female for annual exam   1. Well female exam with routine gynecological exam Gynecologic exam status post total abdominal hysterectomy and menopause.  Last Pap test was in 2017, no indication to repeat Pap test.  Breast exam normal.  Last mammogram in 2018 normal.  Will schedule a screening mammogram this year.  Colonoscopy 2019.  Health labs with family physician.  Body mass index  29.79.  Continue with fitness and healthy nutrition.  2. Postmenopausal Longstanding postmenopausal.  3. Screening for osteoporosis Vitamin D supplements, calcium intake total of 1.2 to 1.5 g/day and regular weightbearing physical activity recommended.  Offer bone density if not done for the last 2 years.  May do bone density through family physician. - DG Bone Density; Future  Princess Bruins MD, 4:28 PM 09/12/2018

## 2018-09-18 ENCOUNTER — Encounter: Payer: Self-pay | Admitting: Obstetrics & Gynecology

## 2018-09-18 NOTE — Patient Instructions (Signed)
1. Well female exam with routine gynecological exam Gynecologic exam status post total abdominal hysterectomy and menopause.  Last Pap test was in 2017, no indication to repeat Pap test.  Breast exam normal.  Last mammogram in 2018 normal.  Will schedule a screening mammogram this year.  Colonoscopy 2019.  Health labs with family physician.  Body mass index 29.79.  Continue with fitness and healthy nutrition.  2. Postmenopausal Longstanding postmenopausal.  3. Screening for osteoporosis Vitamin D supplements, calcium intake total of 1.2 to 1.5 g/day and regular weightbearing physical activity recommended.  Offer bone density if not done for the last 2 years.  May do bone density through family physician. - DG Bone Density; Future  Cheyenne Montgomery, it was a pleasure to see you today.

## 2018-10-09 ENCOUNTER — Encounter (INDEPENDENT_AMBULATORY_CARE_PROVIDER_SITE_OTHER): Payer: Medicare Other | Admitting: Ophthalmology

## 2018-11-06 ENCOUNTER — Other Ambulatory Visit: Payer: Self-pay

## 2018-11-06 ENCOUNTER — Encounter (INDEPENDENT_AMBULATORY_CARE_PROVIDER_SITE_OTHER): Payer: Medicare Other | Admitting: Ophthalmology

## 2018-11-06 DIAGNOSIS — H43813 Vitreous degeneration, bilateral: Secondary | ICD-10-CM

## 2018-11-06 DIAGNOSIS — H353122 Nonexudative age-related macular degeneration, left eye, intermediate dry stage: Secondary | ICD-10-CM

## 2018-11-06 DIAGNOSIS — H353211 Exudative age-related macular degeneration, right eye, with active choroidal neovascularization: Secondary | ICD-10-CM | POA: Diagnosis not present

## 2018-12-04 ENCOUNTER — Encounter (INDEPENDENT_AMBULATORY_CARE_PROVIDER_SITE_OTHER): Payer: Medicare Other | Admitting: Ophthalmology

## 2018-12-04 ENCOUNTER — Other Ambulatory Visit: Payer: Self-pay

## 2018-12-04 DIAGNOSIS — H353211 Exudative age-related macular degeneration, right eye, with active choroidal neovascularization: Secondary | ICD-10-CM | POA: Diagnosis not present

## 2018-12-04 DIAGNOSIS — H43813 Vitreous degeneration, bilateral: Secondary | ICD-10-CM

## 2018-12-04 DIAGNOSIS — H353122 Nonexudative age-related macular degeneration, left eye, intermediate dry stage: Secondary | ICD-10-CM

## 2019-01-02 ENCOUNTER — Other Ambulatory Visit: Payer: Self-pay

## 2019-01-02 ENCOUNTER — Encounter (INDEPENDENT_AMBULATORY_CARE_PROVIDER_SITE_OTHER): Payer: Medicare Other | Admitting: Ophthalmology

## 2019-01-02 DIAGNOSIS — H353122 Nonexudative age-related macular degeneration, left eye, intermediate dry stage: Secondary | ICD-10-CM | POA: Diagnosis not present

## 2019-01-02 DIAGNOSIS — H43813 Vitreous degeneration, bilateral: Secondary | ICD-10-CM | POA: Diagnosis not present

## 2019-01-02 DIAGNOSIS — H353211 Exudative age-related macular degeneration, right eye, with active choroidal neovascularization: Secondary | ICD-10-CM

## 2019-01-30 ENCOUNTER — Other Ambulatory Visit: Payer: Self-pay

## 2019-01-30 ENCOUNTER — Encounter (INDEPENDENT_AMBULATORY_CARE_PROVIDER_SITE_OTHER): Payer: Medicare Other | Admitting: Ophthalmology

## 2019-01-30 DIAGNOSIS — H353211 Exudative age-related macular degeneration, right eye, with active choroidal neovascularization: Secondary | ICD-10-CM

## 2019-01-30 DIAGNOSIS — H43813 Vitreous degeneration, bilateral: Secondary | ICD-10-CM

## 2019-01-30 DIAGNOSIS — H353122 Nonexudative age-related macular degeneration, left eye, intermediate dry stage: Secondary | ICD-10-CM | POA: Diagnosis not present

## 2019-02-27 ENCOUNTER — Encounter (INDEPENDENT_AMBULATORY_CARE_PROVIDER_SITE_OTHER): Payer: Medicare Other | Admitting: Ophthalmology

## 2019-02-27 ENCOUNTER — Other Ambulatory Visit: Payer: Self-pay

## 2019-02-27 DIAGNOSIS — H43813 Vitreous degeneration, bilateral: Secondary | ICD-10-CM

## 2019-02-27 DIAGNOSIS — H353122 Nonexudative age-related macular degeneration, left eye, intermediate dry stage: Secondary | ICD-10-CM

## 2019-02-27 DIAGNOSIS — H353211 Exudative age-related macular degeneration, right eye, with active choroidal neovascularization: Secondary | ICD-10-CM

## 2019-03-27 ENCOUNTER — Other Ambulatory Visit: Payer: Self-pay

## 2019-03-27 ENCOUNTER — Encounter (INDEPENDENT_AMBULATORY_CARE_PROVIDER_SITE_OTHER): Payer: Medicare Other | Admitting: Ophthalmology

## 2019-03-27 DIAGNOSIS — H353122 Nonexudative age-related macular degeneration, left eye, intermediate dry stage: Secondary | ICD-10-CM

## 2019-03-27 DIAGNOSIS — H353211 Exudative age-related macular degeneration, right eye, with active choroidal neovascularization: Secondary | ICD-10-CM | POA: Diagnosis not present

## 2019-03-27 DIAGNOSIS — H43813 Vitreous degeneration, bilateral: Secondary | ICD-10-CM

## 2019-05-01 ENCOUNTER — Encounter (INDEPENDENT_AMBULATORY_CARE_PROVIDER_SITE_OTHER): Payer: Medicare Other | Admitting: Ophthalmology

## 2019-05-01 DIAGNOSIS — H353211 Exudative age-related macular degeneration, right eye, with active choroidal neovascularization: Secondary | ICD-10-CM | POA: Diagnosis not present

## 2019-05-01 DIAGNOSIS — H43813 Vitreous degeneration, bilateral: Secondary | ICD-10-CM | POA: Diagnosis not present

## 2019-05-01 DIAGNOSIS — H353122 Nonexudative age-related macular degeneration, left eye, intermediate dry stage: Secondary | ICD-10-CM | POA: Diagnosis not present

## 2019-06-05 ENCOUNTER — Encounter (INDEPENDENT_AMBULATORY_CARE_PROVIDER_SITE_OTHER): Payer: Medicare Other | Admitting: Ophthalmology

## 2019-06-05 DIAGNOSIS — H353211 Exudative age-related macular degeneration, right eye, with active choroidal neovascularization: Secondary | ICD-10-CM

## 2019-06-05 DIAGNOSIS — H43813 Vitreous degeneration, bilateral: Secondary | ICD-10-CM | POA: Diagnosis not present

## 2019-06-05 DIAGNOSIS — H353122 Nonexudative age-related macular degeneration, left eye, intermediate dry stage: Secondary | ICD-10-CM

## 2019-07-10 ENCOUNTER — Encounter (INDEPENDENT_AMBULATORY_CARE_PROVIDER_SITE_OTHER): Payer: Medicare Other | Admitting: Ophthalmology

## 2019-07-10 DIAGNOSIS — H43813 Vitreous degeneration, bilateral: Secondary | ICD-10-CM

## 2019-07-10 DIAGNOSIS — H353211 Exudative age-related macular degeneration, right eye, with active choroidal neovascularization: Secondary | ICD-10-CM | POA: Diagnosis not present

## 2019-07-10 DIAGNOSIS — H353122 Nonexudative age-related macular degeneration, left eye, intermediate dry stage: Secondary | ICD-10-CM | POA: Diagnosis not present

## 2019-07-17 ENCOUNTER — Ambulatory Visit: Payer: Medicare Other | Attending: Internal Medicine

## 2019-07-17 DIAGNOSIS — Z23 Encounter for immunization: Secondary | ICD-10-CM | POA: Insufficient documentation

## 2019-07-17 NOTE — Progress Notes (Signed)
   Covid-19 Vaccination Clinic  Name:  Cheyenne Montgomery    MRN: [id number] DOB: 04-27-1935  07/17/2019  Ms. Cheyenne Montgomery was observed post Covid-19 immunization for 15 minutes without incidence. She was provided with Vaccine Information Sheet and instruction to access the V-Safe system.   Ms. Cheyenne Montgomery was instructed to call 911 with any severe reactions post vaccine: Marland Kitchen Difficulty breathing  . Swelling of your face and throat  . A fast heartbeat  . A bad rash all over your body  . Dizziness and weakness    Immunizations Administered    Name Date Dose VIS Date Route   Pfizer COVID-19 Vaccine 07/17/2019  3:55 PM 0.3 mL 06/08/2019 Intramuscular   Manufacturer: [place]   Lot: [id number]   [place]: [id number]

## 2019-08-07 ENCOUNTER — Ambulatory Visit: Payer: Medicare Other | Attending: Internal Medicine

## 2019-08-07 DIAGNOSIS — Z23 Encounter for immunization: Secondary | ICD-10-CM

## 2019-08-07 NOTE — Progress Notes (Signed)
   Covid-19 Vaccination Clinic  Name:  Cheyenne Montgomery    MRN: [id number] DOB: 12-04-1934  08/07/2019  Cheyenne Montgomery was observed post Covid-19 immunization for 15 minutes without incidence. She was provided with Vaccine Information Sheet and instruction to access the V-Safe system.   Cheyenne Montgomery was instructed to call 911 with any severe reactions post vaccine: Marland Kitchen Difficulty breathing  . Swelling of your face and throat  . A fast heartbeat  . A bad rash all over your body  . Dizziness and weakness    Immunizations Administered    Name Date Dose VIS Date Route   Pfizer COVID-19 Vaccine 08/07/2019  2:18 PM 0.3 mL 06/08/2019 Intramuscular   Manufacturer: [place]   Lot: [id number]   [place]: [id number]

## 2019-08-21 ENCOUNTER — Encounter (INDEPENDENT_AMBULATORY_CARE_PROVIDER_SITE_OTHER): Payer: Medicare Other | Admitting: Ophthalmology

## 2019-08-21 DIAGNOSIS — H353122 Nonexudative age-related macular degeneration, left eye, intermediate dry stage: Secondary | ICD-10-CM

## 2019-08-21 DIAGNOSIS — H43813 Vitreous degeneration, bilateral: Secondary | ICD-10-CM

## 2019-08-21 DIAGNOSIS — H353211 Exudative age-related macular degeneration, right eye, with active choroidal neovascularization: Secondary | ICD-10-CM | POA: Diagnosis not present

## 2019-09-18 ENCOUNTER — Other Ambulatory Visit: Payer: Self-pay

## 2019-09-18 ENCOUNTER — Encounter (INDEPENDENT_AMBULATORY_CARE_PROVIDER_SITE_OTHER): Payer: Medicare Other | Admitting: Ophthalmology

## 2019-09-18 DIAGNOSIS — H353211 Exudative age-related macular degeneration, right eye, with active choroidal neovascularization: Secondary | ICD-10-CM | POA: Diagnosis not present

## 2019-09-18 DIAGNOSIS — H353122 Nonexudative age-related macular degeneration, left eye, intermediate dry stage: Secondary | ICD-10-CM | POA: Diagnosis not present

## 2019-09-18 DIAGNOSIS — H43813 Vitreous degeneration, bilateral: Secondary | ICD-10-CM

## 2019-09-18 DIAGNOSIS — H35372 Puckering of macula, left eye: Secondary | ICD-10-CM

## 2019-10-23 ENCOUNTER — Encounter (INDEPENDENT_AMBULATORY_CARE_PROVIDER_SITE_OTHER): Payer: Medicare Other | Admitting: Ophthalmology

## 2019-10-23 ENCOUNTER — Other Ambulatory Visit: Payer: Self-pay

## 2019-10-23 DIAGNOSIS — H43813 Vitreous degeneration, bilateral: Secondary | ICD-10-CM | POA: Diagnosis not present

## 2019-10-23 DIAGNOSIS — H353122 Nonexudative age-related macular degeneration, left eye, intermediate dry stage: Secondary | ICD-10-CM

## 2019-10-23 DIAGNOSIS — H353211 Exudative age-related macular degeneration, right eye, with active choroidal neovascularization: Secondary | ICD-10-CM

## 2019-12-04 ENCOUNTER — Encounter (INDEPENDENT_AMBULATORY_CARE_PROVIDER_SITE_OTHER): Payer: Medicare Other | Admitting: Ophthalmology

## 2019-12-04 ENCOUNTER — Other Ambulatory Visit: Payer: Self-pay

## 2019-12-04 DIAGNOSIS — H43813 Vitreous degeneration, bilateral: Secondary | ICD-10-CM | POA: Diagnosis not present

## 2019-12-04 DIAGNOSIS — H353122 Nonexudative age-related macular degeneration, left eye, intermediate dry stage: Secondary | ICD-10-CM | POA: Diagnosis not present

## 2019-12-04 DIAGNOSIS — H353211 Exudative age-related macular degeneration, right eye, with active choroidal neovascularization: Secondary | ICD-10-CM | POA: Diagnosis not present

## 2020-01-15 ENCOUNTER — Encounter (INDEPENDENT_AMBULATORY_CARE_PROVIDER_SITE_OTHER): Payer: Medicare Other | Admitting: Ophthalmology

## 2020-01-18 ENCOUNTER — Other Ambulatory Visit: Payer: Self-pay

## 2020-01-18 ENCOUNTER — Encounter (INDEPENDENT_AMBULATORY_CARE_PROVIDER_SITE_OTHER): Payer: Medicare Other | Admitting: Ophthalmology

## 2020-01-18 DIAGNOSIS — H43813 Vitreous degeneration, bilateral: Secondary | ICD-10-CM | POA: Diagnosis not present

## 2020-01-18 DIAGNOSIS — H353122 Nonexudative age-related macular degeneration, left eye, intermediate dry stage: Secondary | ICD-10-CM | POA: Diagnosis not present

## 2020-01-18 DIAGNOSIS — H353211 Exudative age-related macular degeneration, right eye, with active choroidal neovascularization: Secondary | ICD-10-CM | POA: Diagnosis not present

## 2020-01-18 DIAGNOSIS — H35372 Puckering of macula, left eye: Secondary | ICD-10-CM | POA: Diagnosis not present

## 2020-02-26 ENCOUNTER — Encounter (INDEPENDENT_AMBULATORY_CARE_PROVIDER_SITE_OTHER): Payer: Medicare Other | Admitting: Ophthalmology

## 2020-02-26 ENCOUNTER — Other Ambulatory Visit: Payer: Self-pay

## 2020-02-26 DIAGNOSIS — H353122 Nonexudative age-related macular degeneration, left eye, intermediate dry stage: Secondary | ICD-10-CM | POA: Diagnosis not present

## 2020-02-26 DIAGNOSIS — H43813 Vitreous degeneration, bilateral: Secondary | ICD-10-CM

## 2020-02-26 DIAGNOSIS — H353211 Exudative age-related macular degeneration, right eye, with active choroidal neovascularization: Secondary | ICD-10-CM | POA: Diagnosis not present

## 2020-03-18 ENCOUNTER — Ambulatory Visit: Payer: Medicare Other | Attending: Internal Medicine

## 2020-03-18 DIAGNOSIS — Z23 Encounter for immunization: Secondary | ICD-10-CM

## 2020-03-18 MED FILL — PFIZER-BIONTECH COVID-19 VA: 30 | 1 days supply | Qty: 0 | Fill #0

## 2020-03-18 NOTE — Progress Notes (Signed)
   Covid-19 Vaccination Clinic  Name:  Kaci B Tillery    MRN: [id number] DOB: 03/29/1935  03/18/2020  Ms. Trantham was observed post Covid-19 immunization for 15 minutes without incident. She was provided with Vaccine Information Sheet and instruction to access the V-Safe system.  Vaccinated by [facility]ette  Ms. Angello was instructed to call 911 with any severe reactions post vaccine: Marland Kitchen Difficulty breathing  . Swelling of face and throat  . A fast heartbeat  . A bad rash all over body  . Dizziness and weakness

## 2020-04-14 ENCOUNTER — Other Ambulatory Visit: Payer: Self-pay

## 2020-04-14 ENCOUNTER — Encounter (INDEPENDENT_AMBULATORY_CARE_PROVIDER_SITE_OTHER): Payer: Medicare Other | Admitting: Ophthalmology

## 2020-04-14 DIAGNOSIS — H353122 Nonexudative age-related macular degeneration, left eye, intermediate dry stage: Secondary | ICD-10-CM | POA: Diagnosis not present

## 2020-04-14 DIAGNOSIS — H43813 Vitreous degeneration, bilateral: Secondary | ICD-10-CM | POA: Diagnosis not present

## 2020-04-14 DIAGNOSIS — H353211 Exudative age-related macular degeneration, right eye, with active choroidal neovascularization: Secondary | ICD-10-CM

## 2020-06-03 ENCOUNTER — Encounter (INDEPENDENT_AMBULATORY_CARE_PROVIDER_SITE_OTHER): Payer: Medicare Other | Admitting: Ophthalmology

## 2020-06-03 ENCOUNTER — Other Ambulatory Visit: Payer: Self-pay

## 2020-06-03 DIAGNOSIS — H43813 Vitreous degeneration, bilateral: Secondary | ICD-10-CM | POA: Diagnosis not present

## 2020-06-03 DIAGNOSIS — H353122 Nonexudative age-related macular degeneration, left eye, intermediate dry stage: Secondary | ICD-10-CM

## 2020-06-03 DIAGNOSIS — H353211 Exudative age-related macular degeneration, right eye, with active choroidal neovascularization: Secondary | ICD-10-CM

## 2020-08-05 ENCOUNTER — Other Ambulatory Visit: Payer: Self-pay

## 2020-08-05 ENCOUNTER — Encounter (INDEPENDENT_AMBULATORY_CARE_PROVIDER_SITE_OTHER): Payer: Medicare Other | Admitting: Ophthalmology

## 2020-08-05 DIAGNOSIS — H353211 Exudative age-related macular degeneration, right eye, with active choroidal neovascularization: Secondary | ICD-10-CM | POA: Diagnosis not present

## 2020-08-05 DIAGNOSIS — H43813 Vitreous degeneration, bilateral: Secondary | ICD-10-CM | POA: Diagnosis not present

## 2020-08-05 DIAGNOSIS — H353122 Nonexudative age-related macular degeneration, left eye, intermediate dry stage: Secondary | ICD-10-CM

## 2020-09-23 ENCOUNTER — Other Ambulatory Visit: Payer: Self-pay

## 2020-09-23 ENCOUNTER — Encounter (INDEPENDENT_AMBULATORY_CARE_PROVIDER_SITE_OTHER): Payer: Medicare Other | Admitting: Ophthalmology

## 2020-09-23 DIAGNOSIS — H43813 Vitreous degeneration, bilateral: Secondary | ICD-10-CM | POA: Diagnosis not present

## 2020-09-23 DIAGNOSIS — H353122 Nonexudative age-related macular degeneration, left eye, intermediate dry stage: Secondary | ICD-10-CM

## 2020-09-23 DIAGNOSIS — H353211 Exudative age-related macular degeneration, right eye, with active choroidal neovascularization: Secondary | ICD-10-CM

## 2020-10-15 ENCOUNTER — Ambulatory Visit: Payer: Medicare Other | Attending: Internal Medicine

## 2020-10-15 DIAGNOSIS — Z23 Encounter for immunization: Secondary | ICD-10-CM

## 2020-10-15 NOTE — Progress Notes (Signed)
   Covid-19 Vaccination Clinic  Name:  Desiree B Antwi    MRN: [id number] DOB: September 05, 1934  10/15/2020  Ms. Biegler was observed post Covid-19 immunization for 15 minutes without incident. She was provided with Vaccine Information Sheet and instruction to access the V-Safe system.   Ms. Murthy was instructed to call 911 with any severe reactions post vaccine: Marland Kitchen Difficulty breathing  . Swelling of face and throat  . A fast heartbeat  . A bad rash all over body  . Dizziness and weakness   Immunizations Administered    Name Date Dose VIS Date Route   Moderna Covid-19 Booster Vaccine 10/15/2020 10:05 AM 0.25 mL 04/16/2020 Intramuscular   Manufacturer: Moderna   Lot: [id number]   [place]: [id number]

## 2020-10-17 ENCOUNTER — Other Ambulatory Visit (HOSPITAL_BASED_OUTPATIENT_CLINIC_OR_DEPARTMENT_OTHER): Payer: Self-pay

## 2020-10-17 MED ORDER — MODERNA COVID-19 VACCINE 100 MCG/0.5ML IM SUSP
INTRAMUSCULAR | 0 refills | Status: AC
  Filled 2020-10-17: qty 0.25, 1d supply, fill #0

## 2020-11-11 ENCOUNTER — Other Ambulatory Visit: Payer: Self-pay

## 2020-11-11 ENCOUNTER — Encounter (INDEPENDENT_AMBULATORY_CARE_PROVIDER_SITE_OTHER): Payer: Medicare Other | Admitting: Ophthalmology

## 2020-11-11 DIAGNOSIS — H353122 Nonexudative age-related macular degeneration, left eye, intermediate dry stage: Secondary | ICD-10-CM | POA: Diagnosis not present

## 2020-11-11 DIAGNOSIS — H43813 Vitreous degeneration, bilateral: Secondary | ICD-10-CM | POA: Diagnosis not present

## 2020-11-11 DIAGNOSIS — H353211 Exudative age-related macular degeneration, right eye, with active choroidal neovascularization: Secondary | ICD-10-CM

## 2021-01-01 ENCOUNTER — Encounter (INDEPENDENT_AMBULATORY_CARE_PROVIDER_SITE_OTHER): Payer: Medicare Other | Admitting: Ophthalmology

## 2021-01-02 ENCOUNTER — Other Ambulatory Visit: Payer: Self-pay

## 2021-01-02 ENCOUNTER — Encounter (INDEPENDENT_AMBULATORY_CARE_PROVIDER_SITE_OTHER): Payer: Medicare Other | Admitting: Ophthalmology

## 2021-01-02 DIAGNOSIS — H353122 Nonexudative age-related macular degeneration, left eye, intermediate dry stage: Secondary | ICD-10-CM

## 2021-01-02 DIAGNOSIS — I10 Essential (primary) hypertension: Secondary | ICD-10-CM | POA: Diagnosis not present

## 2021-01-02 DIAGNOSIS — H43813 Vitreous degeneration, bilateral: Secondary | ICD-10-CM

## 2021-01-02 DIAGNOSIS — H353211 Exudative age-related macular degeneration, right eye, with active choroidal neovascularization: Secondary | ICD-10-CM | POA: Diagnosis not present

## 2021-02-24 ENCOUNTER — Encounter (INDEPENDENT_AMBULATORY_CARE_PROVIDER_SITE_OTHER): Payer: Medicare Other | Admitting: Ophthalmology

## 2021-03-03 ENCOUNTER — Other Ambulatory Visit: Payer: Self-pay

## 2021-03-03 ENCOUNTER — Encounter (INDEPENDENT_AMBULATORY_CARE_PROVIDER_SITE_OTHER): Payer: Medicare Other | Admitting: Ophthalmology

## 2021-03-03 DIAGNOSIS — H353122 Nonexudative age-related macular degeneration, left eye, intermediate dry stage: Secondary | ICD-10-CM | POA: Diagnosis not present

## 2021-03-03 DIAGNOSIS — H353211 Exudative age-related macular degeneration, right eye, with active choroidal neovascularization: Secondary | ICD-10-CM | POA: Diagnosis not present

## 2021-03-03 DIAGNOSIS — H43813 Vitreous degeneration, bilateral: Secondary | ICD-10-CM

## 2021-04-07 ENCOUNTER — Ambulatory Visit: Payer: Medicare Other | Attending: Internal Medicine

## 2021-04-07 DIAGNOSIS — Z23 Encounter for immunization: Secondary | ICD-10-CM

## 2021-04-07 NOTE — Progress Notes (Signed)
   Covid-19 Vaccination Clinic  Name:  Dorianne B Westby    MRN: [id number] DOB: Myka 20, 1936  04/07/2021  Ms. Goldsmith was observed post Covid-19 immunization for 15 minutes without incident. She was provided with Vaccine Information Sheet and instruction to access the V-Safe system.   Ms. Mosher was instructed to call 911 with any severe reactions post vaccine: Difficulty breathing  Swelling of face and throat  A fast heartbeat  A bad rash all over body  Dizziness and weakness

## 2021-04-17 ENCOUNTER — Other Ambulatory Visit (HOSPITAL_BASED_OUTPATIENT_CLINIC_OR_DEPARTMENT_OTHER): Payer: Self-pay

## 2021-04-17 MED ORDER — COVID-19MRNA BIVAL VACC PFIZER 30 MCG/0.3ML IM SUSP
INTRAMUSCULAR | 0 refills | Status: AC
  Filled 2021-04-17: qty 0.3, 1d supply, fill #0

## 2021-04-28 ENCOUNTER — Other Ambulatory Visit: Payer: Self-pay

## 2021-04-28 ENCOUNTER — Encounter (INDEPENDENT_AMBULATORY_CARE_PROVIDER_SITE_OTHER): Payer: Medicare Other | Admitting: Ophthalmology

## 2021-04-28 DIAGNOSIS — H43813 Vitreous degeneration, bilateral: Secondary | ICD-10-CM | POA: Diagnosis not present

## 2021-04-28 DIAGNOSIS — H353122 Nonexudative age-related macular degeneration, left eye, intermediate dry stage: Secondary | ICD-10-CM

## 2021-04-28 DIAGNOSIS — H353211 Exudative age-related macular degeneration, right eye, with active choroidal neovascularization: Secondary | ICD-10-CM

## 2021-06-16 ENCOUNTER — Encounter (INDEPENDENT_AMBULATORY_CARE_PROVIDER_SITE_OTHER): Payer: Medicare Other | Admitting: Ophthalmology

## 2021-06-16 ENCOUNTER — Other Ambulatory Visit: Payer: Self-pay

## 2021-06-16 DIAGNOSIS — H43813 Vitreous degeneration, bilateral: Secondary | ICD-10-CM | POA: Diagnosis not present

## 2021-06-16 DIAGNOSIS — H353122 Nonexudative age-related macular degeneration, left eye, intermediate dry stage: Secondary | ICD-10-CM | POA: Diagnosis not present

## 2021-06-16 DIAGNOSIS — H353211 Exudative age-related macular degeneration, right eye, with active choroidal neovascularization: Secondary | ICD-10-CM

## 2021-08-18 ENCOUNTER — Encounter (INDEPENDENT_AMBULATORY_CARE_PROVIDER_SITE_OTHER): Payer: Medicare Other | Admitting: Ophthalmology

## 2021-08-18 ENCOUNTER — Other Ambulatory Visit: Payer: Self-pay

## 2021-08-18 DIAGNOSIS — H353122 Nonexudative age-related macular degeneration, left eye, intermediate dry stage: Secondary | ICD-10-CM | POA: Diagnosis not present

## 2021-08-18 DIAGNOSIS — H353211 Exudative age-related macular degeneration, right eye, with active choroidal neovascularization: Secondary | ICD-10-CM

## 2021-08-18 DIAGNOSIS — H43813 Vitreous degeneration, bilateral: Secondary | ICD-10-CM

## 2021-10-27 ENCOUNTER — Encounter (INDEPENDENT_AMBULATORY_CARE_PROVIDER_SITE_OTHER): Payer: Medicare Other | Admitting: Ophthalmology

## 2021-10-27 DIAGNOSIS — H353122 Nonexudative age-related macular degeneration, left eye, intermediate dry stage: Secondary | ICD-10-CM | POA: Diagnosis not present

## 2021-10-27 DIAGNOSIS — H43813 Vitreous degeneration, bilateral: Secondary | ICD-10-CM

## 2021-10-27 DIAGNOSIS — H353211 Exudative age-related macular degeneration, right eye, with active choroidal neovascularization: Secondary | ICD-10-CM

## 2021-12-11 ENCOUNTER — Other Ambulatory Visit: Payer: Self-pay | Admitting: Ophthalmology

## 2021-12-11 DIAGNOSIS — R519 Headache, unspecified: Secondary | ICD-10-CM

## 2021-12-14 ENCOUNTER — Other Ambulatory Visit: Payer: Self-pay | Admitting: Ophthalmology

## 2021-12-14 DIAGNOSIS — H5000 Unspecified esotropia: Secondary | ICD-10-CM

## 2021-12-26 ENCOUNTER — Ambulatory Visit
Admission: RE | Admit: 2021-12-26 | Discharge: 2021-12-26 | Disposition: A | Discharge status: Home / Self Care | Payer: Medicare Other | Source: Ambulatory Visit | Attending: Ophthalmology | Admitting: Ophthalmology

## 2021-12-26 ENCOUNTER — Other Ambulatory Visit: Payer: Self-pay | Admitting: Ophthalmology

## 2021-12-26 DIAGNOSIS — H5000 Unspecified esotropia: Secondary | ICD-10-CM

## 2022-01-05 ENCOUNTER — Encounter (INDEPENDENT_AMBULATORY_CARE_PROVIDER_SITE_OTHER): Payer: Medicare Other | Admitting: Ophthalmology

## 2022-01-05 DIAGNOSIS — H353211 Exudative age-related macular degeneration, right eye, with active choroidal neovascularization: Secondary | ICD-10-CM

## 2022-01-05 DIAGNOSIS — H353122 Nonexudative age-related macular degeneration, left eye, intermediate dry stage: Secondary | ICD-10-CM

## 2022-01-05 DIAGNOSIS — H43813 Vitreous degeneration, bilateral: Secondary | ICD-10-CM | POA: Diagnosis not present

## 2022-03-11 ENCOUNTER — Ambulatory Visit: Payer: Medicare Other | Admitting: Neurology

## 2022-03-16 ENCOUNTER — Encounter (INDEPENDENT_AMBULATORY_CARE_PROVIDER_SITE_OTHER): Payer: Medicare Other | Admitting: Ophthalmology

## 2022-03-16 DIAGNOSIS — H353122 Nonexudative age-related macular degeneration, left eye, intermediate dry stage: Secondary | ICD-10-CM | POA: Diagnosis not present

## 2022-03-16 DIAGNOSIS — H43813 Vitreous degeneration, bilateral: Secondary | ICD-10-CM | POA: Diagnosis not present

## 2022-03-16 DIAGNOSIS — H353211 Exudative age-related macular degeneration, right eye, with active choroidal neovascularization: Secondary | ICD-10-CM

## 2022-05-11 ENCOUNTER — Encounter (INDEPENDENT_AMBULATORY_CARE_PROVIDER_SITE_OTHER): Payer: Medicare Other | Admitting: Ophthalmology

## 2022-05-11 DIAGNOSIS — H353211 Exudative age-related macular degeneration, right eye, with active choroidal neovascularization: Secondary | ICD-10-CM | POA: Diagnosis not present

## 2022-05-11 DIAGNOSIS — H43813 Vitreous degeneration, bilateral: Secondary | ICD-10-CM

## 2022-05-11 DIAGNOSIS — H353122 Nonexudative age-related macular degeneration, left eye, intermediate dry stage: Secondary | ICD-10-CM

## 2022-05-27 ENCOUNTER — Emergency Department (HOSPITAL_COMMUNITY)
Admission: EM | Admit: 2022-05-27 | Discharge: 2022-05-27 | Discharge status: Against Medical Advice | Payer: Medicare Other | Attending: Emergency Medicine | Admitting: Emergency Medicine

## 2022-05-27 ENCOUNTER — Encounter (HOSPITAL_COMMUNITY): Payer: Self-pay

## 2022-05-27 ENCOUNTER — Other Ambulatory Visit: Payer: Self-pay

## 2022-05-27 ENCOUNTER — Emergency Department (HOSPITAL_COMMUNITY): Payer: Medicare Other

## 2022-05-27 DIAGNOSIS — Z5321 Procedure and treatment not carried out due to patient leaving prior to being seen by health care provider: Secondary | ICD-10-CM | POA: Diagnosis not present

## 2022-05-27 DIAGNOSIS — R0602 Shortness of breath: Secondary | ICD-10-CM | POA: Diagnosis not present

## 2022-05-27 DIAGNOSIS — R197 Diarrhea, unspecified: Secondary | ICD-10-CM | POA: Diagnosis not present

## 2022-05-27 DIAGNOSIS — R220 Localized swelling, mass and lump, head: Secondary | ICD-10-CM | POA: Insufficient documentation

## 2022-05-27 DIAGNOSIS — R1084 Generalized abdominal pain: Secondary | ICD-10-CM | POA: Insufficient documentation

## 2022-05-27 LAB — COMPREHENSIVE METABOLIC PANEL
ALT: 15 U/L (ref 0–44)
AST: 23 U/L (ref 15–41)
Albumin: 3.8 g/dL (ref 3.5–5.0)
Alkaline Phosphatase: 60 U/L (ref 38–126)
Anion gap: 5 (ref 5–15)
BUN: 11 mg/dL (ref 8–23)
CO2: 30 mmol/L (ref 22–32)
Calcium: 9.6 mg/dL (ref 8.9–10.3)
Chloride: 101 mmol/L (ref 98–111)
Creatinine, Ser: 0.62 mg/dL (ref 0.44–1.00)
GFR, Estimated: >60 mL/min (ref >60)
Glucose, Bld: 109 mg/dL — ABNORMAL HIGH (ref 70–99)
Potassium: 3.9 mmol/L (ref 3.5–5.1)
Sodium: 136 mmol/L (ref 135–145)
Total Bilirubin: 0.8 mg/dL (ref 0.3–1.2)
Total Protein: 6.4 g/dL — ABNORMAL LOW (ref 6.5–8.1)

## 2022-05-27 LAB — CBC WITH DIFFERENTIAL/PLATELET
Abs Immature Granulocytes: 0.01 10*3/uL (ref 0.00–0.07)
Basophils Absolute: 0 10*3/uL (ref 0.0–0.1)
Basophils Relative: 1 %
Eosinophils Absolute: 0.2 10*3/uL (ref 0.0–0.5)
Eosinophils Relative: 3 %
HCT: 42.2 % (ref 36.0–46.0)
Hemoglobin: 13.5 g/dL (ref 12.0–15.0)
Immature Granulocytes: 0 %
Lymphocytes Relative: 20 %
Lymphs Abs: 1.1 10*3/uL (ref 0.7–4.0)
MCH: 31.1 pg (ref 26.0–34.0)
MCHC: 32 g/dL (ref 30.0–36.0)
MCV: 97.2 fL (ref 80.0–100.0)
Monocytes Absolute: 0.4 10*3/uL (ref 0.1–1.0)
Monocytes Relative: 8 %
Neutro Abs: 3.6 10*3/uL (ref 1.7–7.7)
Neutrophils Relative %: 68 %
Platelets: 190 10*3/uL (ref 150–400)
RBC: 4.34 MIL/uL (ref 3.87–5.11)
RDW: 13.2 % (ref 11.5–15.5)
WBC: 5.3 10*3/uL (ref 4.0–10.5)
nRBC: 0 % (ref 0.0–0.2)

## 2022-05-27 LAB — LIPASE, BLOOD: Lipase: 34 U/L (ref 11–51)

## 2022-05-27 NOTE — ED Provider Triage Note (Signed)
Emergency Medicine Provider Triage Evaluation Note  Cheyenne Montgomery , a 87 y.o. female  was evaluated in triage.  Pt complains of concerns for an allergic reaction to the RSV vaccine 2 days ago. Notes that she has had slight facial swelling, generalized abdominal pain, diarrhea, slight shortness of breath.  Denies chest pain at this time.  Review of Systems  Positive:  Negative:   Physical Exam  BP (!) 163/98 (BP Location: Left Arm)   Pulse 82   Temp 97.6 F (36.4 C) (Oral)   Resp 18   Ht '5\' 1"'$  (1.549 m)   Wt 65.8 kg   SpO2 99%   BMI 27.40 kg/m  Gen:   Awake, no distress   Resp:  Normal effort  MSK:   Moves extremities without difficulty  Other:  Able to speak in clear complete sentences. Patent airway.   Medical Decision Making  Medically screening exam initiated at 12:49 PM.  Appropriate orders placed.  Cheyenne Montgomery was informed that the remainder of the evaluation will be completed by another provider, this initial triage assessment does not replace that evaluation, and the importance of remaining in the ED until their evaluation is complete.     Reatha Sur A, PA-C 05/27/22 1305

## 2022-05-27 NOTE — ED Triage Notes (Signed)
Patient reports that she had the RSV vaccine 2 days ago. Patient c/o slight facial swelling, abdominal pain, diarrhea, and slight SOB. Patient is talking in complete sentences. O2 sats-99% on room air.

## 2022-07-27 ENCOUNTER — Encounter (INDEPENDENT_AMBULATORY_CARE_PROVIDER_SITE_OTHER): Payer: Medicare Other | Admitting: Ophthalmology

## 2022-07-27 DIAGNOSIS — H353122 Nonexudative age-related macular degeneration, left eye, intermediate dry stage: Secondary | ICD-10-CM

## 2022-07-27 DIAGNOSIS — H43813 Vitreous degeneration, bilateral: Secondary | ICD-10-CM | POA: Diagnosis not present

## 2022-07-27 DIAGNOSIS — H353211 Exudative age-related macular degeneration, right eye, with active choroidal neovascularization: Secondary | ICD-10-CM | POA: Diagnosis not present

## 2022-10-19 ENCOUNTER — Encounter (INDEPENDENT_AMBULATORY_CARE_PROVIDER_SITE_OTHER): Payer: Medicare Other | Admitting: Ophthalmology

## 2022-10-19 DIAGNOSIS — H43813 Vitreous degeneration, bilateral: Secondary | ICD-10-CM

## 2022-10-19 DIAGNOSIS — H353122 Nonexudative age-related macular degeneration, left eye, intermediate dry stage: Secondary | ICD-10-CM | POA: Diagnosis not present

## 2022-10-19 DIAGNOSIS — H353211 Exudative age-related macular degeneration, right eye, with active choroidal neovascularization: Secondary | ICD-10-CM

## 2022-10-26 ENCOUNTER — Encounter (INDEPENDENT_AMBULATORY_CARE_PROVIDER_SITE_OTHER): Payer: Medicare Other | Admitting: Ophthalmology

## 2023-01-18 ENCOUNTER — Encounter (INDEPENDENT_AMBULATORY_CARE_PROVIDER_SITE_OTHER): Payer: Medicare Other | Admitting: Ophthalmology

## 2023-01-18 DIAGNOSIS — H353211 Exudative age-related macular degeneration, right eye, with active choroidal neovascularization: Secondary | ICD-10-CM

## 2023-01-18 DIAGNOSIS — H353122 Nonexudative age-related macular degeneration, left eye, intermediate dry stage: Secondary | ICD-10-CM

## 2023-01-18 DIAGNOSIS — H43813 Vitreous degeneration, bilateral: Secondary | ICD-10-CM

## 2023-03-08 ENCOUNTER — Other Ambulatory Visit (HOSPITAL_BASED_OUTPATIENT_CLINIC_OR_DEPARTMENT_OTHER): Payer: Self-pay

## 2023-03-08 MED ORDER — COVID-19 MRNA VAC-TRIS(PFIZER) 30 MCG/0.3ML IM SUSY
0.3000 mL | PREFILLED_SYRINGE | Freq: Once | INTRAMUSCULAR | 0 refills | Status: AC
  Filled 2023-03-08: qty 0.3, 1d supply, fill #0

## 2023-04-19 ENCOUNTER — Encounter (INDEPENDENT_AMBULATORY_CARE_PROVIDER_SITE_OTHER): Payer: Medicare Other | Admitting: Ophthalmology

## 2023-04-20 ENCOUNTER — Encounter (INDEPENDENT_AMBULATORY_CARE_PROVIDER_SITE_OTHER): Payer: Medicare Other | Admitting: Ophthalmology

## 2023-04-20 DIAGNOSIS — H353211 Exudative age-related macular degeneration, right eye, with active choroidal neovascularization: Secondary | ICD-10-CM

## 2023-04-20 DIAGNOSIS — H353122 Nonexudative age-related macular degeneration, left eye, intermediate dry stage: Secondary | ICD-10-CM | POA: Diagnosis not present

## 2023-04-20 DIAGNOSIS — H43813 Vitreous degeneration, bilateral: Secondary | ICD-10-CM

## 2023-07-19 ENCOUNTER — Encounter (INDEPENDENT_AMBULATORY_CARE_PROVIDER_SITE_OTHER): Payer: Medicare Other | Admitting: Ophthalmology

## 2023-07-19 DIAGNOSIS — H353122 Nonexudative age-related macular degeneration, left eye, intermediate dry stage: Secondary | ICD-10-CM | POA: Diagnosis not present

## 2023-07-19 DIAGNOSIS — H353211 Exudative age-related macular degeneration, right eye, with active choroidal neovascularization: Secondary | ICD-10-CM | POA: Diagnosis not present

## 2023-07-19 DIAGNOSIS — H43813 Vitreous degeneration, bilateral: Secondary | ICD-10-CM | POA: Diagnosis not present

## 2023-10-25 ENCOUNTER — Encounter (INDEPENDENT_AMBULATORY_CARE_PROVIDER_SITE_OTHER): Payer: Medicare Other | Admitting: Ophthalmology

## 2023-10-25 DIAGNOSIS — H353122 Nonexudative age-related macular degeneration, left eye, intermediate dry stage: Secondary | ICD-10-CM | POA: Diagnosis not present

## 2023-10-25 DIAGNOSIS — H43813 Vitreous degeneration, bilateral: Secondary | ICD-10-CM

## 2023-10-25 DIAGNOSIS — H353211 Exudative age-related macular degeneration, right eye, with active choroidal neovascularization: Secondary | ICD-10-CM

## 2024-02-07 ENCOUNTER — Encounter (INDEPENDENT_AMBULATORY_CARE_PROVIDER_SITE_OTHER): Admitting: Ophthalmology

## 2024-02-07 DIAGNOSIS — H43813 Vitreous degeneration, bilateral: Secondary | ICD-10-CM

## 2024-02-07 DIAGNOSIS — H353122 Nonexudative age-related macular degeneration, left eye, intermediate dry stage: Secondary | ICD-10-CM

## 2024-02-07 DIAGNOSIS — H353211 Exudative age-related macular degeneration, right eye, with active choroidal neovascularization: Secondary | ICD-10-CM

## 2024-05-29 ENCOUNTER — Encounter (INDEPENDENT_AMBULATORY_CARE_PROVIDER_SITE_OTHER): Admitting: Ophthalmology

## 2024-05-29 DIAGNOSIS — H353122 Nonexudative age-related macular degeneration, left eye, intermediate dry stage: Secondary | ICD-10-CM | POA: Diagnosis not present

## 2024-05-29 DIAGNOSIS — H353211 Exudative age-related macular degeneration, right eye, with active choroidal neovascularization: Secondary | ICD-10-CM | POA: Diagnosis not present

## 2024-05-29 DIAGNOSIS — H43813 Vitreous degeneration, bilateral: Secondary | ICD-10-CM

## 2024-09-18 ENCOUNTER — Encounter (INDEPENDENT_AMBULATORY_CARE_PROVIDER_SITE_OTHER): Admitting: Ophthalmology
